# Patient Record
Sex: Female | Born: 1970 | Hispanic: No | Marital: Married | Smoking: Never smoker
Health system: Southern US, Community
[De-identification: ages and names within clinical notes are randomized; demographics above are authoritative.]

## PROBLEM LIST (undated history)

## (undated) DIAGNOSIS — Z8049 Family history of malignant neoplasm of other genital organs: Secondary | ICD-10-CM

## (undated) DIAGNOSIS — C801 Malignant (primary) neoplasm, unspecified: Secondary | ICD-10-CM

## (undated) DIAGNOSIS — M545 Low back pain, unspecified: Secondary | ICD-10-CM

## (undated) DIAGNOSIS — E786 Lipoprotein deficiency: Secondary | ICD-10-CM

## (undated) DIAGNOSIS — R7303 Prediabetes: Secondary | ICD-10-CM

## (undated) DIAGNOSIS — K625 Hemorrhage of anus and rectum: Secondary | ICD-10-CM

## (undated) DIAGNOSIS — R87629 Unspecified abnormal cytological findings in specimens from vagina: Secondary | ICD-10-CM

## (undated) HISTORY — PX: OTHER SURGICAL HISTORY: SHX169

## (undated) HISTORY — DX: Hemorrhage of anus and rectum: K62.5

## (undated) HISTORY — PX: TONSILLECTOMY: SUR1361

## (undated) HISTORY — DX: Family history of malignant neoplasm of other genital organs: Z80.49

## (undated) HISTORY — DX: Low back pain: M54.5

## (undated) HISTORY — DX: Low back pain, unspecified: M54.50

## (undated) HISTORY — PX: HEMORRHOID SURGERY: SHX153

## (undated) HISTORY — DX: Lipoprotein deficiency: E78.6

## (undated) HISTORY — DX: Unspecified abnormal cytological findings in specimens from vagina: R87.629

---

## 2004-04-07 ENCOUNTER — Other Ambulatory Visit: Admission: RE | Admit: 2004-04-07 | Discharge: 2004-04-07 | Payer: Self-pay | Admitting: Obstetrics and Gynecology

## 2005-09-27 ENCOUNTER — Other Ambulatory Visit: Admission: RE | Admit: 2005-09-27 | Discharge: 2005-09-27 | Payer: Self-pay | Admitting: Family Medicine

## 2008-05-08 ENCOUNTER — Other Ambulatory Visit: Admission: RE | Admit: 2008-05-08 | Discharge: 2008-05-08 | Payer: Self-pay | Admitting: Family Medicine

## 2015-05-27 ENCOUNTER — Ambulatory Visit: Payer: Self-pay | Admitting: Dietician

## 2016-11-22 ENCOUNTER — Encounter: Payer: Self-pay | Admitting: Interventional Cardiology

## 2016-12-05 ENCOUNTER — Encounter (INDEPENDENT_AMBULATORY_CARE_PROVIDER_SITE_OTHER): Payer: Self-pay

## 2016-12-05 ENCOUNTER — Encounter: Payer: Self-pay | Admitting: Interventional Cardiology

## 2016-12-05 ENCOUNTER — Ambulatory Visit (INDEPENDENT_AMBULATORY_CARE_PROVIDER_SITE_OTHER): Payer: BLUE CROSS/BLUE SHIELD | Admitting: Interventional Cardiology

## 2016-12-05 VITALS — BP 136/100 | HR 72 | Ht 66.0 in | Wt 161.8 lb

## 2016-12-05 DIAGNOSIS — R079 Chest pain, unspecified: Secondary | ICD-10-CM

## 2016-12-05 DIAGNOSIS — R030 Elevated blood-pressure reading, without diagnosis of hypertension: Secondary | ICD-10-CM | POA: Diagnosis not present

## 2016-12-05 NOTE — Patient Instructions (Signed)
Medication Instructions:  Your physician recommends that you continue on your current medications as directed. Please refer to the Current Medication list given to you today.   Labwork: None ordered  Testing/Procedures: Your physician has requested that you have a stress echocardiogram. For further information please visit [email or web address]. Please follow instruction sheet as given.    Follow-Up: Based on results  Any Other Special Instructions Will Be Listed Below (If Applicable).     If you need a refill on your cardiac medications before your next appointment, please call your pharmacy.

## 2016-12-05 NOTE — Progress Notes (Addendum)
Cardiology Office Note   Date:  12/05/2016   ID:  Krystal Davis, DOB July 31, 1971, MRN [id number]  PCP:  Tawanna Solo, MD  Dr. Kathyrn Lass  No chief complaint on file.  Abdominal pain/ chest pain  Wt Readings from Last 3 Encounters:  12/05/16 161 lb 12.8 oz (73.4 kg)       History of Present Illness: Krystal Davis is a 46 y.o. female  Who was referred by Dr. Kathyrn Lass for chest pain.  She has had chest pressure radiating to the arm intermittently over the last 3 months.  She has had some lightheadedness with walking.  She can have some pain in the head as well.  Rest relieves the pain. She has not fully passed out. It felt like a low blood sugar. She has had some elevated blood sugar readings.  She presents to our clinic for evaluation.      Chest pain can occur at any time- not necessarily with exertion.  Happened in the [place] office.  She does the treadmill 5-7 x/week for 45-60 minutes.  She has some lightheadedness on the treadmill which made her stop.  No chest pressure while on the treadmill.    No prior stress testing.    No family h/o CAD.    She has taken simvastatin for the past 2 weeks after reporting her sx and having a slightly increased blood sugar.     Past Medical History:  Diagnosis Date  . Abnormal Pap smear of vagina   . Low back pain   . Low HDL (under 40)   . Rectal bleeding     Past Surgical History:  Procedure Laterality Date  . CESAREAN SECTION    . HEMORRHOID SURGERY    . TONSILLITIS       Current Outpatient Prescriptions  Medication Sig Dispense Refill  . simvastatin (ZOCOR) 20 MG tablet Take 20 mg by mouth daily.  0   No current facility-administered medications for this visit.     Allergies:   Patient has no known allergies.    Social History:  The patient  reports that she has never smoked. She has never used smokeless tobacco. She reports that she does not drink alcohol or use drugs.   Family History:  The patient's family  history includes CVA in her father; Cancer in her mother; Cancer - Ovarian in her mother; Heart attack in her father; Hypertension in her father.    ROS:  Please see the history of present illness.   Otherwise, review of systems are positive for chest discomfort.   All other systems are reviewed and negative.    PHYSICAL EXAM: VS:  BP (!) 136/100 (BP Location: Right Arm, Patient Position: Sitting, Cuff Size: Normal)   Pulse 72   Ht 5\' 6"  (1.676 m)   Wt 161 lb 12.8 oz (73.4 kg)   LMP 11/23/2016   SpO2 98%   BMI 26.12 kg/m  , BMI Body mass index is 26.12 kg/m. GEN: Well nourished, well developed, in no acute distress  HEENT: normal  Neck: no JVD, carotid bruits, or masses Cardiac: RRR; no murmurs, rubs, or gallops,no edema ; 2+ PT pulses bilaterally Respiratory:  clear to auscultation bilaterally, normal work of breathing GI: soft, nontender, nondistended, + BS MS: no deformity or atrophy  Skin: warm and dry, no rash Neuro:  Strength and sensation are intact Psych: euthymic mood, full affect   EKG:   The ekg ordered today demonstrates NSR, NSST  Recent Labs: No results found for requested labs within last 8760 hours.   Lipid Panel No results found for: CHOL, TRIG, HDL, CHOLHDL, VLDL, LDLCALC, LDLDIRECT   Other studies Reviewed: Additional studies/ records that were reviewed today with results demonstrating: Prior ECG showed NSR with NSST.Marland Kitchen   ASSESSMENT AND PLAN:  1. Chest pain: some typical features but it does not occur with her 60 minute sessions on the treadmill.  It typically occurs with much less exertion.   Plan for stress echo to evaluate for ischemia.  ECG may not be interpretable with plain ETT.   2. Continue simvastatin.  Continue healthy diet to try to maintain low blood sugar.   3. BP slightly increased today.  Continue to follow.  Will see what it does with exercise at the stress test.     Current medicines are reviewed at length with the patient today.   The patient concerns regarding her medicines were addressed.  The following changes have been made:  No change  Labs/ tests ordered today include:  No orders of the defined types were placed in this encounter.   Recommend 150 minutes/week of aerobic exercise Low fat, low carb, high fiber diet recommended  Disposition:   FU in for stress test   Signed, Larae Grooms, MD  12/05/2016 3:55 PM    [facility] [place], [place], [place]  [address] Phone: [phone]; Fax: [phone]

## 2016-12-28 ENCOUNTER — Telehealth (HOSPITAL_COMMUNITY): Payer: Self-pay | Admitting: *Deleted

## 2016-12-28 NOTE — Telephone Encounter (Signed)
Patient given detailed instructions per Stress Test Requisition Sheet for test on 01/01/17 at 7:30.Patient Notified to arrive 30 minutes early, and that it is imperative to arrive on time for appointment to keep from having the test rescheduled.  Patient verbalized understanding. Krystal Davis

## 2017-01-01 ENCOUNTER — Ambulatory Visit (HOSPITAL_COMMUNITY): Payer: BLUE CROSS/BLUE SHIELD | Attending: Cardiology

## 2017-01-01 ENCOUNTER — Ambulatory Visit (HOSPITAL_COMMUNITY): Payer: BLUE CROSS/BLUE SHIELD

## 2017-01-01 DIAGNOSIS — R079 Chest pain, unspecified: Secondary | ICD-10-CM | POA: Diagnosis not present

## 2019-03-06 ENCOUNTER — Other Ambulatory Visit: Payer: Self-pay | Admitting: Family Medicine

## 2019-03-07 ENCOUNTER — Other Ambulatory Visit: Payer: Self-pay | Admitting: Family Medicine

## 2019-03-07 DIAGNOSIS — N6310 Unspecified lump in the right breast, unspecified quadrant: Secondary | ICD-10-CM

## 2019-03-14 ENCOUNTER — Ambulatory Visit
Admission: RE | Admit: 2019-03-14 | Discharge: 2019-03-14 | Disposition: A | Discharge status: Home / Self Care | Payer: BLUE CROSS/BLUE SHIELD | Source: Ambulatory Visit | Attending: Family Medicine | Admitting: Family Medicine

## 2019-03-14 ENCOUNTER — Ambulatory Visit
Admission: RE | Admit: 2019-03-14 | Discharge: 2019-03-14 | Disposition: A | Discharge status: Home / Self Care | Payer: BC Managed Care – PPO | Source: Ambulatory Visit | Attending: Family Medicine | Admitting: Family Medicine

## 2019-03-14 ENCOUNTER — Other Ambulatory Visit: Payer: Self-pay

## 2019-03-14 ENCOUNTER — Other Ambulatory Visit: Payer: Self-pay | Admitting: Family Medicine

## 2019-03-14 DIAGNOSIS — N6310 Unspecified lump in the right breast, unspecified quadrant: Secondary | ICD-10-CM

## 2019-03-17 ENCOUNTER — Ambulatory Visit
Admission: RE | Admit: 2019-03-17 | Discharge: 2019-03-17 | Disposition: A | Discharge status: Home / Self Care | Payer: BC Managed Care – PPO | Source: Ambulatory Visit | Attending: Family Medicine | Admitting: Family Medicine

## 2019-03-17 ENCOUNTER — Other Ambulatory Visit: Payer: Self-pay

## 2019-03-17 DIAGNOSIS — N6310 Unspecified lump in the right breast, unspecified quadrant: Secondary | ICD-10-CM

## 2019-03-19 ENCOUNTER — Telehealth: Payer: Self-pay | Admitting: Hematology and Oncology

## 2019-03-19 NOTE — Telephone Encounter (Signed)
Spoke with patient to confirm morning [facility] appointment for 7/29, packet will be mailed and emailed

## 2019-03-20 ENCOUNTER — Encounter: Payer: Self-pay | Admitting: *Deleted

## 2019-03-25 ENCOUNTER — Other Ambulatory Visit: Payer: Self-pay | Admitting: *Deleted

## 2019-03-25 ENCOUNTER — Other Ambulatory Visit: Payer: Self-pay

## 2019-03-25 ENCOUNTER — Encounter: Payer: Self-pay | Admitting: *Deleted

## 2019-03-25 DIAGNOSIS — C50411 Malignant neoplasm of upper-outer quadrant of right female breast: Secondary | ICD-10-CM | POA: Insufficient documentation

## 2019-03-25 DIAGNOSIS — C50919 Malignant neoplasm of unspecified site of unspecified female breast: Secondary | ICD-10-CM

## 2019-03-25 DIAGNOSIS — Z170 Estrogen receptor positive status [ER+]: Secondary | ICD-10-CM | POA: Insufficient documentation

## 2019-03-25 NOTE — Progress Notes (Addendum)
[place] NOTE  Patient Care Team: Kathyrn Lass, MD as PCP - General (Family Medicine) Mauro Kaufmann, RN as Oncology Nurse Navigator Rockwell Germany, RN as Oncology Nurse Navigator Rolm Bookbinder, MD as Consulting Physician (General Surgery) Nicholas Lose, MD as Consulting Physician (Hematology and Oncology) Eppie Gibson, MD as Attending Physician (Radiation Oncology)  CHIEF COMPLAINTS/PURPOSE OF CONSULTATION:  Newly diagnosed breast cancer  HISTORY OF PRESENTING ILLNESS:  Krystal Davis 48 y.o. female is here because of recent diagnosis of invasive ductal carcinoma of the right breast. The cancer was detected on a diagnostic mammogram on 03/14/19 after the patient palpated a lump in the right breast. It showed a 3.4cm mass in the upper outer quadrant and one right axillary lymph node with cortical thickening measuring 3.39m. Biopsy on 03/17/19 showed grade 1 invasive ductal carcinoma, HER-2 negative (0), ER 100%, PR 60%, Ki67 40% in the right breast and no malignancy in the axilla. She presents to the clinic today for initial evaluation.   I reviewed her records extensively and collaborated the history with the patient.  SUMMARY OF ONCOLOGIC HISTORY: Oncology History  Malignant neoplasm of upper-outer quadrant of right breast in female, estrogen receptor positive ([place]  03/25/2019 Initial Diagnosis   Patient palpated right breast lump. Diagnostic mammogram showed 3.4cm mass in the UOQ and one right axillary lymph node with cortical thickening measuring 3.643m Biopsy showed IDC, grade 1, HER-2 - (0), ER+ 100%, PR+ 60%, Ki67 40% in the right breast and no malignancy in the axilla.     MEDICAL HISTORY:  Past Medical History:  Diagnosis Date  . Abnormal Pap smear of vagina   . Low back pain   . Low HDL (under 40)   . Rectal bleeding     SURGICAL HISTORY: Past Surgical History:  Procedure Laterality Date  . CESAREAN SECTION    . HEMORRHOID SURGERY     . TONSILLECTOMY    . TONSILLITIS      SOCIAL HISTORY: Social History   Socioeconomic History  . Marital status: Married    Spouse name: Not on file  . Number of children: 1  . Years of education: Not on file  . Highest education level: Not on file  Occupational History  . Occupation: ITMaterials engineerSocial Needs  . Financial resource strain: Not on file  . Food insecurity    Worry: Not on file    Inability: Not on file  . Transportation needs    Medical: Not on file    Non-medical: Not on file  Tobacco Use  . Smoking status: Never Smoker  . Smokeless tobacco: Never Used  Substance and Sexual Activity  . Alcohol use: No  . Drug use: No  . Sexual activity: Not on file  Lifestyle  . Physical activity    Days per week: Not on file    Minutes per session: Not on file  . Stress: Not on file  Relationships  . Social coHerbalistn phone: Not on file    Gets together: Not on file    Attends religious service: Not on file    Active member of club or organization: Not on file    Attends meetings of clubs or organizations: Not on file    Relationship status: Not on file  . Intimate partner violence    Fear of current or ex partner: Not on file    Emotionally abused: Not on file    Physically abused:  Not on file    Forced sexual activity: Not on file  Other Topics Concern  . Not on file  Social History Narrative  . Not on file    FAMILY HISTORY: Family History  Problem Relation Age of Onset  . Cancer Mother        Uterine and Endometrial  . Cancer - Ovarian Mother   . Hypertension Father   . CVA Father   . Heart attack Father     ALLERGIES:  has No Known Allergies.  MEDICATIONS:  Current Outpatient Medications  Medication Sig Dispense Refill  . Biotin 10000 MCG TABS Take 10,000 mcg by mouth daily.    . fenoprofen (NALFON) 600 MG TABS tablet Take 600 mg by mouth daily.    . metFORMIN (GLUCOPHAGE) 500 MG tablet Take 500 mg by mouth daily with  breakfast.    . Multiple Vitamins-Minerals (MULTIVITAMIN WITH MINERALS) tablet Take 1 tablet by mouth daily.    . Omega-3 Fatty Acids (FISH OIL) 1200 MG CAPS Take 1,200 mg by mouth daily.    . Polysaccharide Iron Complex (IRON UP PO) Take 65 mg by mouth daily.    . simvastatin (ZOCOR) 20 MG tablet Take 20 mg by mouth daily.  0  . vitamin B-12 (CYANOCOBALAMIN) 500 MCG tablet Take 500 mcg by mouth daily.    . vitamin E 400 UNIT capsule Take 400 Units by mouth daily.    . tamoxifen (NOLVADEX) 20 MG tablet Take 1 tablet (20 mg total) by mouth daily. 30 tablet 0   No current facility-administered medications for this visit.     REVIEW OF SYSTEMS:   Constitutional: Denies fevers, chills or abnormal night sweats Eyes: Denies blurriness of vision, double vision or watery eyes Ears, nose, mouth, throat, and face: Denies mucositis or sore throat Respiratory: Denies cough, dyspnea or wheezes Cardiovascular: Denies palpitation, chest discomfort or lower extremity swelling Gastrointestinal:  Denies nausea, heartburn or change in bowel habits Skin: Denies abnormal skin rashes Lymphatics: Denies new lymphadenopathy or easy bruising Neurological:Denies numbness, tingling or new weaknesses Behavioral/Psych: Mood is stable, no new changes  Breast: Palpable right breast lump All other systems were reviewed with the patient and are negative.  PHYSICAL EXAMINATION: ECOG PERFORMANCE STATUS: 1 - Symptomatic but completely ambulatory  Vitals:   03/26/19 0911  BP: 130/81  Pulse: 78  Resp: 18  Temp: 98.2 F (36.8 C)  SpO2: 100%   Filed Weights   03/26/19 0911  Weight: 170 lb 3.2 oz (77.2 kg)    GENERAL:alert, no distress and comfortable SKIN: skin color, texture, turgor are normal, no rashes or significant lesions EYES: normal, conjunctiva are pink and non-injected, sclera clear OROPHARYNX:no exudate, no erythema and lips, buccal mucosa, and tongue normal  NECK: supple, thyroid normal size,  non-tender, without nodularity LYMPH:  no palpable lymphadenopathy in the cervical, axillary or inguinal LUNGS: clear to auscultation and percussion with normal breathing effort HEART: regular rate & rhythm and no murmurs and no lower extremity edema ABDOMEN:abdomen soft, non-tender and normal bowel sounds Musculoskeletal:no cyanosis of digits and no clubbing  PSYCH: alert & oriented x 3 with fluent speech NEURO: no focal motor/sensory deficits   LABORATORY DATA:  I have reviewed the data as listed Lab Results  Component Value Date   WBC 7.2 03/26/2019   HGB 12.7 03/26/2019   HCT 38.6 03/26/2019   MCV 88.7 03/26/2019   PLT 305 03/26/2019   Lab Results  Component Value Date   NA 139 03/26/2019  K 4.2 03/26/2019   CL 104 03/26/2019   CO2 26 03/26/2019    RADIOGRAPHIC STUDIES: I have personally reviewed the radiological reports and agreed with the findings in the report.  ASSESSMENT AND PLAN:  Malignant neoplasm of upper-outer quadrant of right breast in female, estrogen receptor positive ([place]) 03/25/2019:Patient palpated right breast lump. Diagnostic mammogram showed 3.4cm mass in the UOQ and one right axillary lymph node with cortical thickening measuring 3.68m. 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 T2 N0 stage Ib clinical stage  Pathology and radiology counseling:Discussed with the patient, the details of pathology including the type of breast cancer,the clinical staging, the significance of ER, PR and HER-2/neu receptors and the implications for treatment. After reviewing the pathology in detail, we proceeded to discuss the different treatment options between surgery, radiation, chemotherapy, antiestrogen therapies.  Recommendations: 1. Breast conserving surgery versus mastectomy based on MRI and targeted node dissection followed by 2. Oncotype DX testing to determine if chemotherapy would be  of any benefit or MammaPrint if the lymph node is positive. 3. Adjuvant radiation therapy followed by 4. Adjuvant antiestrogen therapy  Breast MRI Genetics consultation  Oncotype counseling: I discussed Oncotype DX test. I explained to the patient that this is a 21 gene panel to evaluate patient tumors DNA to calculate recurrence score. This would help determine whether patient has high risk or intermediate risk or low risk breast cancer. She understands that if her tumor was found to be high risk, she would benefit from systemic chemotherapy. If low risk, no need of chemotherapy. If she was found to be intermediate risk, we would need to evaluate the score as well as other risk factors and determine if an abbreviated chemotherapy may be of benefit.  Return to clinic after surgery to discuss final pathology report and then determine if Oncotype DX/MammaPrint testing will need to be sent.     All questions were answered. The patient knows to call the clinic with any problems, questions or concerns.   VRulon Eisenmenger MD 03/26/2019    I, Molly Dorshimer, am acting as scribe for VNicholas Lose MD.  I have reviewed the above documentation for accuracy and completeness, and I agree with the above.  Addendum: Because it could be few weeks before her surgery, I started her on tamoxifen 20 mg daily.  I went over the risks and benefits of tamoxifen.  She certainly is concerned about the risk of uterine changes family history of uterine cancer in her mother.  When we are ready to put her on long-term antiestrogen therapy we will have to make a decision regarding ovarian suppression with anastrozole therapy versus continuing on tamoxifen.

## 2019-03-26 ENCOUNTER — Encounter: Payer: Self-pay | Admitting: Physical Therapy

## 2019-03-26 ENCOUNTER — Encounter: Payer: Self-pay | Admitting: Hematology and Oncology

## 2019-03-26 ENCOUNTER — Other Ambulatory Visit: Payer: Self-pay | Admitting: *Deleted

## 2019-03-26 ENCOUNTER — Other Ambulatory Visit: Payer: Self-pay

## 2019-03-26 ENCOUNTER — Encounter: Payer: Self-pay | Admitting: Radiation Oncology

## 2019-03-26 ENCOUNTER — Inpatient Hospital Stay: Payer: BC Managed Care – PPO | Attending: Hematology and Oncology | Admitting: Hematology and Oncology

## 2019-03-26 ENCOUNTER — Encounter: Payer: Self-pay | Admitting: Licensed Clinical Social Worker

## 2019-03-26 ENCOUNTER — Ambulatory Visit: Payer: BC Managed Care – PPO | Attending: General Surgery | Admitting: Physical Therapy

## 2019-03-26 ENCOUNTER — Inpatient Hospital Stay: Payer: BC Managed Care – PPO

## 2019-03-26 ENCOUNTER — Ambulatory Visit (HOSPITAL_BASED_OUTPATIENT_CLINIC_OR_DEPARTMENT_OTHER): Payer: BC Managed Care – PPO | Admitting: Licensed Clinical Social Worker

## 2019-03-26 ENCOUNTER — Ambulatory Visit
Admission: RE | Admit: 2019-03-26 | Discharge: 2019-03-26 | Disposition: A | Discharge status: Home / Self Care | Payer: BC Managed Care – PPO | Source: Ambulatory Visit | Attending: Radiation Oncology | Admitting: Radiation Oncology

## 2019-03-26 DIAGNOSIS — C50411 Malignant neoplasm of upper-outer quadrant of right female breast: Secondary | ICD-10-CM

## 2019-03-26 DIAGNOSIS — Z7984 Long term (current) use of oral hypoglycemic drugs: Secondary | ICD-10-CM

## 2019-03-26 DIAGNOSIS — Z170 Estrogen receptor positive status [ER+]: Secondary | ICD-10-CM | POA: Insufficient documentation

## 2019-03-26 DIAGNOSIS — Z8049 Family history of malignant neoplasm of other genital organs: Secondary | ICD-10-CM | POA: Diagnosis not present

## 2019-03-26 DIAGNOSIS — R293 Abnormal posture: Secondary | ICD-10-CM | POA: Insufficient documentation

## 2019-03-26 DIAGNOSIS — Z79899 Other long term (current) drug therapy: Secondary | ICD-10-CM | POA: Diagnosis not present

## 2019-03-26 DIAGNOSIS — C50919 Malignant neoplasm of unspecified site of unspecified female breast: Secondary | ICD-10-CM

## 2019-03-26 LAB — CBC WITH DIFFERENTIAL (CANCER CENTER ONLY)
Abs Immature Granulocytes: 0.02 10*3/uL (ref 0.00–0.07)
Basophils Absolute: 0.1 10*3/uL (ref 0.0–0.1)
Basophils Relative: 1 %
Eosinophils Absolute: 0.2 10*3/uL (ref 0.0–0.5)
Eosinophils Relative: 2 %
HCT: 38.6 % (ref 36.0–46.0)
Hemoglobin: 12.7 g/dL (ref 12.0–15.0)
Immature Granulocytes: 0 %
Lymphocytes Relative: 30 %
Lymphs Abs: 2.2 10*3/uL (ref 0.7–4.0)
MCH: 29.2 pg (ref 26.0–34.0)
MCHC: 32.9 g/dL (ref 30.0–36.0)
MCV: 88.7 fL (ref 80.0–100.0)
Monocytes Absolute: 0.5 10*3/uL (ref 0.1–1.0)
Monocytes Relative: 7 %
Neutro Abs: 4.3 10*3/uL (ref 1.7–7.7)
Neutrophils Relative %: 60 %
Platelet Count: 305 10*3/uL (ref 150–400)
RBC: 4.35 MIL/uL (ref 3.87–5.11)
RDW: 12.3 % (ref 11.5–15.5)
WBC Count: 7.2 10*3/uL (ref 4.0–10.5)
nRBC: 0 % (ref 0.0–0.2)

## 2019-03-26 LAB — CMP (CANCER CENTER ONLY)
ALT: 15 U/L (ref 0–44)
AST: 13 U/L — ABNORMAL LOW (ref 15–41)
Albumin: 3.6 g/dL (ref 3.5–5.0)
Alkaline Phosphatase: 60 U/L (ref 38–126)
Anion gap: 9 (ref 5–15)
BUN: 12 mg/dL (ref 6–20)
CO2: 26 mmol/L (ref 22–32)
Calcium: 9.4 mg/dL (ref 8.9–10.3)
Chloride: 104 mmol/L (ref 98–111)
Creatinine: 0.84 mg/dL (ref 0.44–1.00)
GFR, Est AFR Am: >60 mL/min (ref >60)
GFR, Estimated: >60 mL/min (ref >60)
Glucose, Bld: 176 mg/dL — ABNORMAL HIGH (ref 70–99)
Potassium: 4.2 mmol/L (ref 3.5–5.1)
Sodium: 139 mmol/L (ref 135–145)
Total Bilirubin: 0.3 mg/dL (ref 0.3–1.2)
Total Protein: 6.7 g/dL (ref 6.5–8.1)

## 2019-03-26 MED ORDER — TAMOXIFEN CITRATE 20 MG PO TABS: 20.0000 mg | ORAL_TABLET | Freq: Every day | ORAL | 0 refills | Status: DC

## 2019-03-26 NOTE — Assessment & Plan Note (Signed)
03/25/2019:Patient palpated right breast lump. Diagnostic mammogram showed 3.4cm mass in the UOQ and one right axillary lymph node with cortical thickening measuring 3.56m. 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 T2 N0 stage Ib clinical stage  Pathology and radiology counseling:Discussed with the patient, the details of pathology including the type of breast cancer,the clinical staging, the significance of ER, PR and HER-2/neu receptors and the implications for treatment. After reviewing the pathology in detail, we proceeded to discuss the different treatment options between surgery, radiation, chemotherapy, antiestrogen therapies.  Recommendations: 1. Breast conserving surgery versus mastectomy based on MRI and targeted node dissection followed by 2. Oncotype DX testing to determine if chemotherapy would be of any benefit or MammaPrint if the lymph node is positive. 3. Adjuvant radiation therapy followed by 4. Adjuvant antiestrogen therapy  Breast MRI Genetics consultation  Oncotype counseling: I discussed Oncotype DX test. I explained to the patient that this is a 21 gene panel to evaluate patient tumors DNA to calculate recurrence score. This would help determine whether patient has high risk or intermediate risk or low risk breast cancer. She understands that if her tumor was found to be high risk, she would benefit from systemic chemotherapy. If low risk, no need of chemotherapy. If she was found to be intermediate risk, we would need to evaluate the score as well as other risk factors and determine if an abbreviated chemotherapy may be of benefit.  Return to clinic after surgery to discuss final pathology report and then determine if Oncotype DX/MammaPrint testing will need to be sent.

## 2019-03-26 NOTE — Therapy (Signed)
[place], [place], [address] Phone: [phone]   Fax:  [phone]  Physical Therapy Evaluation  Patient Details  Name: Krystal Davis MRN: [id number] Date of Birth: 1971-04-18 Referring Provider (PT): Dr. Rolm Bookbinder   Encounter Date: 03/26/2019  PT End of Session - 03/26/19 1115    Visit Number  1    Number of Visits  2    Date for PT Re-Evaluation  05/21/19    PT Start Time  1125    PT Stop Time  1153    PT Time Calculation (min)  28 min    Activity Tolerance  Patient tolerated treatment well    Behavior During Therapy  [facility] for tasks assessed/performed       Past Medical History:  Diagnosis Date  . Abnormal Pap smear of vagina   . Low back pain   . Low HDL (under 40)   . Rectal bleeding     Past Surgical History:  Procedure Laterality Date  . CESAREAN SECTION    . HEMORRHOID SURGERY    . TONSILLECTOMY    . TONSILLITIS      There were no vitals filed for this visit.   Subjective Assessment - 03/26/19 1107    Subjective  Patient reports she is here today to be seen by her medical team for her newly diagnosed right breast cancer.    Pertinent History  Patient was diagnosed on 03/14/2019 with right grade I invasive ductal carcinoma breast cancer. It measures 3.4 cm and is located in the upper outer quadrant. It is ER/PR positive and HER2 negative with a Ki67 of 40%.    Patient Stated Goals  Reduce lymphedema risk and learn post op shoulder ROM HEP    Currently in Pain?  No/denies         [facility] PT Assessment - 03/26/19 0001      Assessment   Medical Diagnosis  Right breast cancer    Referring Provider (PT)  Dr. Rolm Bookbinder    Onset Date/Surgical Date  03/14/19    Hand Dominance  Right    Prior Therapy  none      Precautions   Precautions  Other (comment)    Precaution Comments  active cancer      Restrictions   Weight Bearing Restrictions  No      Balance  Screen   Has the patient fallen in the past 6 months  No    Has the patient had a decrease in activity level because of a fear of falling?   No    Is the patient reluctant to leave their home because of a fear of falling?   No      Home Environment   Living Environment  Private residence    Living Arrangements  Spouse/significant other    Available Help at Discharge  Family      Prior Function   Level of Independence  Independent    Vocation  Full time employment    Engineer, materials    Leisure  She walks 6-7 days per week for 1 hour      Cognition   Overall Cognitive Status  Within Functional Limits for tasks assessed      Posture/Postural Control   Posture/Postural Control  Postural limitations    Postural Limitations  Rounded Shoulders;Forward head      ROM / Strength   AROM / PROM / Strength  AROM;Strength  AROM   AROM Assessment Site  Shoulder;Cervical    Right/Left Shoulder  Right;Left    Right Shoulder Extension  52 Degrees    Right Shoulder Flexion  149 Degrees    Right Shoulder ABduction  156 Degrees    Right Shoulder Internal Rotation  73 Degrees    Right Shoulder External Rotation  81 Degrees    Left Shoulder Extension  56 Degrees    Left Shoulder Flexion  144 Degrees    Left Shoulder ABduction  158 Degrees    Left Shoulder Internal Rotation  69 Degrees    Left Shoulder External Rotation  89 Degrees      Strength   Overall Strength  Within functional limits for tasks performed        LYMPHEDEMA/ONCOLOGY QUESTIONNAIRE - 03/26/19 1112      Type   Cancer Type  Right breast cancer      Lymphedema Assessments   Lymphedema Assessments  Upper extremities      Right Upper Extremity Lymphedema   10 cm Proximal to Olecranon Process  31.4 cm    Olecranon Process  28 cm    10 cm Proximal to Ulnar Styloid Process  23.8 cm    Just Proximal to Ulnar Styloid Process  15.5 cm    Across Hand at [employer]  19.6 cm    At [place] of 2nd  Digit  6 cm      Left Upper Extremity Lymphedema   10 cm Proximal to Olecranon Process  32.1 cm    Olecranon Process  28.5 cm    10 cm Proximal to Ulnar Styloid Process  23.5 cm    Just Proximal to Ulnar Styloid Process  15.6 cm    Across Hand at [employer]  19.1 cm    At [place] of 2nd Digit  6 cm          Quick Dash - 03/26/19 0001    Open a tight or new jar  No difficulty    Do heavy household chores (wash walls, wash floors)  No difficulty    Carry a shopping bag or briefcase  No difficulty    Wash your back  No difficulty    Use a knife to cut food  No difficulty    Recreational activities in which you take some force or impact through your arm, shoulder, or hand (golf, hammering, tennis)  No difficulty    During the past week, to what extent has your arm, shoulder or hand problem interfered with your normal social activities with family, friends, neighbors, or groups?  Not at all    During the past week, to what extent has your arm, shoulder or hand problem limited your work or other regular daily activities  Not at all    Arm, shoulder, or hand pain.  None    Tingling (pins and needles) in your arm, shoulder, or hand  None    Difficulty Sleeping  No difficulty    DASH Score  0 %        Objective measurements completed on examination: See above findings.        Patient was instructed today in a home exercise program today for post op shoulder range of motion. These included active assist shoulder flexion in sitting, scapular retraction, wall walking with shoulder abduction, and hands behind head external rotation.  She was encouraged to do these twice a day, holding 3 seconds and repeating 5 times when permitted by  her physician.          PT Education - 03/26/19 1112    Education Details  Lymphedema risk reduction and post op shoulder ROM HEP    Person(s) Educated  Patient    Methods  Explanation;Demonstration;Handout    Comprehension  Returned  demonstration;Verbalized understanding          PT Long Term Goals - 03/26/19 1118      PT LONG TERM GOAL #1   Title  Patient will demonstrate she has regained full shoulder ROM and function post operatively compared to baselines.    Time  8    Period  Weeks    Status  New      Breast Clinic Goals - 03/26/19 1118      Patient will be able to verbalize understanding of pertinent lymphedema risk reduction practices relevant to her diagnosis specifically related to skin care.   Time  1    Status  Achieved      Patient will be able to return demonstrate and/or verbalize understanding of the post-op home exercise program related to regaining shoulder range of motion.   Time  1    Period  Days    Status  Achieved      Patient will be able to verbalize understanding of the importance of attending the postoperative After Breast Cancer Class for further lymphedema risk reduction education and therapeutic exercise.   Time  1    Period  Days    Status  Achieved            Plan - 03/26/19 1116    Clinical Impression Statement  Patient was diagnosed on 03/14/2019 with right grade I invasive ductal carcinoma breast cancer. It measures 3.4 cm and is located in the upper outer quadrant. It is ER/PR positive and HER2 negative with a Ki67 of 40%. Her multidisciplinary medical team met prior to her assessments to determine a recommended treatment plan. She is planning to have a right lumpectomy or mastectomy with a sentinel node biopsy and targeted node dissection for 1 enlarged lymph node followed by Oncotype testing, radiation, and anti-estrogen therapy. She will benefit from a post op PT visit to determine needs.    Stability/Clinical Decision Making  Stable/Uncomplicated    Clinical Decision Making  Low    Rehab Potential  Excellent    PT Frequency  --   Eval and 1 f/u visit   PT Treatment/Interventions  ADLs/Self Care Home Management;Therapeutic exercise;Patient/family education    PT  Next Visit Plan  Will reassess 3-4 weeks post op to determine needs    PT Home Exercise Plan  Post op shoulder ROM HEP    Consulted and Agree with Plan of Care  Patient       Patient will benefit from skilled therapeutic intervention in order to improve the following deficits and impairments:     Visit Diagnosis: 1. Malignant neoplasm of upper-outer quadrant of right breast in female, estrogen receptor positive ([place])   2. Abnormal posture      Patient will follow up at outpatient cancer rehab 3-4 weeks following surgery.  If the patient requires physical therapy at that time, a specific plan will be dictated and sent to the referring physician for approval. The patient was educated today on appropriate basic range of motion exercises to begin post operatively and the importance of attending the After Breast Cancer class following surgery.  Patient was educated today on lymphedema risk reduction practices as it pertains  to recommendations that will benefit the patient immediately following surgery.  She verbalized good understanding.     Problem List Patient Active Problem List   Diagnosis Date Noted  . Malignant neoplasm of upper-outer quadrant of right breast in female, estrogen receptor positive ([place]) 03/25/2019   Annia Friendly, PT 03/26/19 12:09 PM  [place], [place], [address] Phone: [phone]   Fax:  [phone]  Name: Krystal Davis MRN: [id number] Date of Birth: [birth year]

## 2019-03-26 NOTE — Progress Notes (Addendum)
Radiation Oncology         [phone] ________________________________  Initial outpatient Consultation  Name: Krystal Davis MRN: [id number]  Date: 03/26/2019  DOB: 12/28/1970  HF:[id number], Lattie Haw, MD  Kathyrn Lass, MD   REFERRING PHYSICIAN: Kathyrn Lass, MD  DIAGNOSIS:    ICD-10-CM   1. Malignant neoplasm of upper-outer quadrant of right breast in female, estrogen receptor positive ([place])  C50.411    Z17.0    Cancer Staging Malignant neoplasm of upper-outer quadrant of right breast in female, estrogen receptor positive ([place]) Staging form: Breast, AJCC 8th Edition - Clinical stage from 03/26/2019: Stage IB (cT2, cN0, cM0, G1, ER+, PR+, HER2-) - Signed by Nicholas Lose, MD on 03/26/2019   CHIEF COMPLAINT: Here to discuss management of right breast cancer  HISTORY OF PRESENT ILLNESS::Krystal Davis is a 48 y.o. female who presented with a palpable right breast mass.  Diagnostic mammogram revealed dense tissue and an ultrasound localized of 3.4 cm mass at the 9:30 position of the right breast.  There is a single axillary node on ultrasound with a focally thickened cortex.  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  Biopsy of the lymph node is benign but this is felt to be discordant.  Patient was seen in person today and her husband spoke with me by speaker phone.  On review of systems she reports glasses, borderline diabetes, and a lump in her breast  PREVIOUS RADIATION THERAPY: No  PAST MEDICAL HISTORY:  has a past medical history of Abnormal Pap smear of vagina, Family history of uterine cancer, Low back pain, Low HDL (under 40), and Rectal bleeding.    PAST SURGICAL HISTORY: Past Surgical History:  Procedure Laterality Date   CESAREAN SECTION     HEMORRHOID SURGERY     TONSILLECTOMY     TONSILLITIS      FAMILY HISTORY: family history includes CVA in her father; Cancer (age of onset: 42)  in her mother; Heart attack in her father; Hypertension in her father.  SOCIAL HISTORY:  reports that she has never smoked. She has never used smokeless tobacco. She reports that she does not drink alcohol or use drugs.  ALLERGIES: Patient has no known allergies.  MEDICATIONS:  Current Outpatient Medications  Medication Sig Dispense Refill   Biotin 10000 MCG TABS Take 10,000 mcg by mouth daily.     fenoprofen (NALFON) 600 MG TABS tablet Take 600 mg by mouth daily.     metFORMIN (GLUCOPHAGE) 500 MG tablet Take 500 mg by mouth daily with breakfast.     Multiple Vitamins-Minerals (MULTIVITAMIN WITH MINERALS) tablet Take 1 tablet by mouth daily.     Omega-3 Fatty Acids (FISH OIL) 1200 MG CAPS Take 1,200 mg by mouth daily.     Polysaccharide Iron Complex (IRON UP PO) Take 65 mg by mouth daily.     simvastatin (ZOCOR) 20 MG tablet Take 20 mg by mouth daily.  0   tamoxifen (NOLVADEX) 20 MG tablet Take 1 tablet (20 mg total) by mouth daily. 30 tablet 0   vitamin B-12 (CYANOCOBALAMIN) 500 MCG tablet Take 500 mcg by mouth daily.     vitamin E 400 UNIT capsule Take 400 Units by mouth daily.     No current facility-administered medications for this encounter.     REVIEW OF SYSTEMS: A 10+ POINT REVIEW OF SYSTEMS WAS OBTAINED including neurology, dermatology, psychiatry, cardiac, respiratory, lymph, extremities, GI, GU,  Musculoskeletal, constitutional, breasts, reproductive, HEENT.  All pertinent positives are noted in the HPI.  All others are negative.   PHYSICAL EXAM:  Vitals with BMI 03/26/2019  Height _0   Weight 170 lbs 3 oz  BMI 35.46  Systolic 568  Diastolic 81  Pulse 78  Respirations 18   General: Alert and oriented, in no acute distress Skin- no concerning lesions  Psychiatric: Judgment and insight are intact. Affect is appropriate. Breasts: In the right breast at the 9 o'clock position there is a mass that measures about 5 cm but some of this could be biopsy artifact. No  other palpable masses appreciated in the breasts or axillae bilaterally.    ECOG = 0  0 - Asymptomatic (Fully active, able to carry on all predisease activities without restriction)  1 - Symptomatic but completely ambulatory (Restricted in physically strenuous activity but ambulatory and able to carry out work of a light or sedentary nature. For example, light housework, office work)  2 - Symptomatic, <50% in bed during the day (Ambulatory and capable of all self care but unable to carry out any work activities. Up and about more than 50% of waking hours)  3 - Symptomatic, >50% in bed, but not bedbound (Capable of only limited self-care, confined to bed or chair 50% or more of waking hours)  4 - Bedbound (Completely disabled. Cannot carry on any self-care. Totally confined to bed or chair)  5 - Death   Eustace Pen MM, Creech RH, Tormey DC, et al. [phone]). "Toxicity and response criteria of the [facility]". [place] Oncol. 5 (6): 649-55   LABORATORY DATA:  Lab Results  Component Value Date   WBC 7.2 03/26/2019   HGB 12.7 03/26/2019   HCT 38.6 03/26/2019   MCV 88.7 03/26/2019   PLT 305 03/26/2019   CMP     Component Value Date/Time   NA 139 03/26/2019 0823   K 4.2 03/26/2019 0823   CL 104 03/26/2019 0823   CO2 26 03/26/2019 0823   GLUCOSE 176 (H) 03/26/2019 0823   BUN 12 03/26/2019 0823   CREATININE 0.84 03/26/2019 0823   CALCIUM 9.4 03/26/2019 0823   PROT 6.7 03/26/2019 0823   ALBUMIN 3.6 03/26/2019 0823   AST 13 (L) 03/26/2019 0823   ALT 15 03/26/2019 0823   ALKPHOS 60 03/26/2019 0823   BILITOT 0.3 03/26/2019 0823   GFRNONAA >60 03/26/2019 0823   GFRAA >60 03/26/2019 0823         RADIOGRAPHY: US Breast Ltd Uni Right Inc Axilla  Result Date: 03/14/2019 CLINICAL DATA:  Palpable abnormality in the RIGHT breast 10 o'clock location. EXAM: DIGITAL DIAGNOSTIC BILATERAL MAMMOGRAM WITH CAD AND TOMO ULTRASOUND RIGHT BREAST COMPARISON:  05/05/2016 and  earlier ACR Breast Density Category c: The breast tissue is heterogeneously dense, which may obscure small masses. FINDINGS: Spot tangential view in the UPPER-OUTER QUADRANT of the RIGHT breast reveals extremely dense tissue without discrete mass or distortion. The skin of the anterior breast is slightly thickened. LEFT breast is negative. Mammographic images were processed with CAD. On physical exam, I palpate a firm mass in the UPPER-OUTER QUADRANT of the RIGHT breast, 7-8 centimeters by my exam. Targeted ultrasound is performed, showing irregular hypoechoic mass with irregular margins in the 9:30 o'clock location of the RIGHT breast 5 centimeters from the nipple. The hypoechoic portion is 2.1 x 2.4 centimeters. On elastography, the lesion is firm and measures larger, 3.4 x 2.0 x 3.4 centimeters. Evaluation of the  RIGHT axilla shows a single lymph node with focally thickened cortex, measuring up to 3.6 millimeters. Other RIGHT axillary lymph nodes have normal morphology. IMPRESSION: 1. Suspicious mass in the UPPER-OUTER QUADRANT of the RIGHT breast estimated to measure 3.4 x 2.0 x 3.4 centimeters. 2. Single RIGHT axillary lymph node with focally thickened cortex. RECOMMENDATION: Ultrasound-guided core biopsy of mass in the 9:30 o'clock location of the RIGHT breast. Ultrasound-guided core biopsy of lymph node with focally thickened cortex in the RIGHT axilla. If biopsies are positive, I would recommend further evaluation with MRI, given the slightly thickened skin of the RIGHT breast. I have discussed the findings and recommendations with the patient. Results were also provided in writing at the conclusion of the visit. If applicable, a reminder letter will be sent to the patient regarding the next appointment. BI-RADS CATEGORY  4: Suspicious. Electronically Signed   By: Nolon Nations M.D.   On: 03/14/2019 11:30   Mm Diag Breast Tomo Bilateral  Result Date: 03/14/2019 CLINICAL DATA:  Palpable abnormality in  the RIGHT breast 10 o'clock location. EXAM: DIGITAL DIAGNOSTIC BILATERAL MAMMOGRAM WITH CAD AND TOMO ULTRASOUND RIGHT BREAST COMPARISON:  05/05/2016 and earlier ACR Breast Density Category c: The breast tissue is heterogeneously dense, which may obscure small masses. FINDINGS: Spot tangential view in the UPPER-OUTER QUADRANT of the RIGHT breast reveals extremely dense tissue without discrete mass or distortion. The skin of the anterior breast is slightly thickened. LEFT breast is negative. Mammographic images were processed with CAD. On physical exam, I palpate a firm mass in the UPPER-OUTER QUADRANT of the RIGHT breast, 7-8 centimeters by my exam. Targeted ultrasound is performed, showing irregular hypoechoic mass with irregular margins in the 9:30 o'clock location of the RIGHT breast 5 centimeters from the nipple. The hypoechoic portion is 2.1 x 2.4 centimeters. On elastography, the lesion is firm and measures larger, 3.4 x 2.0 x 3.4 centimeters. Evaluation of the RIGHT axilla shows a single lymph node with focally thickened cortex, measuring up to 3.6 millimeters. Other RIGHT axillary lymph nodes have normal morphology. IMPRESSION: 1. Suspicious mass in the UPPER-OUTER QUADRANT of the RIGHT breast estimated to measure 3.4 x 2.0 x 3.4 centimeters. 2. Single RIGHT axillary lymph node with focally thickened cortex. RECOMMENDATION: Ultrasound-guided core biopsy of mass in the 9:30 o'clock location of the RIGHT breast. Ultrasound-guided core biopsy of lymph node with focally thickened cortex in the RIGHT axilla. If biopsies are positive, I would recommend further evaluation with MRI, given the slightly thickened skin of the RIGHT breast. I have discussed the findings and recommendations with the patient. Results were also provided in writing at the conclusion of the visit. If applicable, a reminder letter will be sent to the patient regarding the next appointment. BI-RADS CATEGORY  4: Suspicious. Electronically Signed    By: Nolon Nations M.D.   On: 03/14/2019 11:30   [place] Axillary Node Core Biopsy Right  Addendum Date: 03/21/2019   ADDENDUM REPORT: 03/19/2019 13:22 ADDENDUM: Pathology revealed GRADE I INVASIVE DUCTAL CARCINOMA of the Right breast, 9:30 o'clock. This was found to be concordant by Dr. Curlene Dolphin. Pathology revealed BENIGN SOFT TISSUE, NO LYMPHOID TISSUE of the Right axilla. This was found to be discordant by Dr. Curlene Dolphin. Pathology results were discussed with the patient by telephone. The patient reported doing well after the biopsies with tenderness at the sites, and stiffness of her Right arm. Post biopsy instructions and care were reviewed and questions were answered. The patient was encouraged to call [facility] for any additional concerns. The patient was referred to [facility] on March 26, 2019. Recommendation for a bilateral breast MRI, given the slightly thickened skin of the Right breast, and heterogeneously dense breasts. Pathology results reported by Terie Purser, RN on 03/19/2019. Electronically Signed   By: Curlene Dolphin M.D.   On: 03/19/2019 13:22   Result Date: 03/21/2019 CLINICAL DATA:  Ultrasound-guided core needle biopsy was recommended of a right axillary lymph node. EXAM: [place] AXILLARY NODE CORE BIOPSY RIGHT COMPARISON:  Previous exam(s). FINDINGS: I met with the patient and we discussed the procedure of ultrasound-guided biopsy, including benefits and alternatives. We discussed the high likelihood of a successful procedure. We discussed the risks of the procedure, including infection, bleeding, tissue injury, clip migration, and inadequate sampling. Informed written consent was given. The usual time-out protocol was performed immediately prior to the procedure. Using sterile technique and 1% Lidocaine as local anesthetic, under direct ultrasound visualization, a 14 gauge  spring-loaded device was used to perform biopsy of a right axillary lymph node using a lateral approach. At the conclusion of the procedure a HydroMARK tissue marker clip was deployed into the biopsy cavity. Follow up 2 view mammogram was performed and dictated separately. IMPRESSION: Ultrasound guided biopsy of a right axillary lymph node. No apparent complications. Electronically Signed: By: Curlene Dolphin M.D. On: 03/17/2019 17:01   Mm Clip Placement Right  Result Date: 03/17/2019 CLINICAL DATA:  Ultrasound-guided biopsies of a palpable right breast mass and a right axillary lymph node were performed. EXAM: DIAGNOSTIC RIGHT MAMMOGRAM POST ULTRASOUND BIOPSIES COMPARISON:  Previous exam(s). FINDINGS: Mammographic images were obtained following ultrasound guided biopsy of a palpable right breast mass 9:30 position and a right axillary lymph node with borderline cortical thickening. A heart shaped biopsy clip is satisfactorily positioned within the biopsied mass. A HydroMARK biopsy clip is satisfactorily positioned in the right axilla. IMPRESSION: Satisfactory position of biopsy clips as described above. Final Assessment: Post Procedure Mammograms for Marker Placement Electronically Signed   By: Curlene Dolphin M.D.   On: 03/17/2019 17:07   [place] Rt Breast Bx W Loc Dev 1st Lesion Img Bx Spec US Guide  Addendum Date: 03/21/2019   ADDENDUM REPORT: 03/19/2019 13:23 ADDENDUM: Pathology revealed GRADE I INVASIVE DUCTAL CARCINOMA of the Right breast, 9:30 o'clock. This was found to be concordant by Dr. Curlene Dolphin. Pathology revealed BENIGN SOFT TISSUE, NO LYMPHOID TISSUE of the Right axilla. This was found to be discordant by Dr. Curlene Dolphin. Pathology results were discussed with the patient by telephone. The patient reported doing well after the biopsies with tenderness at the sites, and stiffness of her Right arm. Post biopsy instructions and care were reviewed and questions were answered. The patient was encouraged  to call [facility] for any additional concerns. The patient was referred to [facility] at [facility] on March 26, 2019. Recommendation for a bilateral breast MRI, given the slightly thickened skin of the Right breast, and heterogeneously dense breasts. Pathology results reported by Terie Purser, RN on 03/19/2019. Electronically Signed   By: Curlene Dolphin M.D.   On: 03/19/2019 13:23   Result Date: 03/21/2019 CLINICAL DATA:  Ultrasound-guided core needle biopsy left recommended for suspicious palpable mass 9:30 position right breast. EXAM: ULTRASOUND GUIDED RIGHT BREAST CORE NEEDLE BIOPSY COMPARISON:  Previous exam(s). FINDINGS: I met with the patient and  we discussed the procedure of ultrasound-guided biopsy, including benefits and alternatives. We discussed the high likelihood of a successful procedure. We discussed the risks of the procedure, including infection, bleeding, tissue injury, clip migration, and inadequate sampling. Informed written consent was given. The usual time-out protocol was performed immediately prior to the procedure. Lesion quadrant: Upper outer quadrant Using sterile technique and 1% Lidocaine as local anesthetic, under direct ultrasound visualization, a 12 gauge spring-loaded device was used to perform biopsy of a suspicious palpable mass right breast 9:30 position using a lateral approach. At the conclusion of the procedure a heart shaped tissue marker clip was deployed into the biopsy cavity. Follow up 2 view mammogram was performed and dictated separately. IMPRESSION: Ultrasound guided biopsy of the right breast. No apparent complications. Electronically Signed: By: Curlene Dolphin M.D. On: 03/17/2019 16:59      IMPRESSION/PLAN: Right breast cancer  She will start lumpectomy tamoxifen before surgery to bridge the gap until this procedure.  She will determine with Dr. Donne Hazel whether she is  a good candidate for lumpectomy.  After surgery it will be determined if she needs chemotherapy based on personalized testing of the tissue.  If she receives chemotherapy this will occur before radiotherapy.  She will certainly be good candidate for radiotherapy if she undergoes breast conserving surgery.  If she undergoes a mastectomy is not yet clear whether she will need radiotherapy.  This will depend upon her final pathology.  It was a pleasure meeting the patient today.  We discussed that radiation would take approximately 6 weeks to complete and that I would give the patient a few weeks to heal following surgery before starting treatment planning.  If chemotherapy were to be given, this would precede radiotherapy. We spoke about acute effects including skin irritation and fatigue as well as much less common late effects including internal organ injury or irritation. We spoke about the latest technology that is used to minimize the risk of late effects for patients undergoing radiotherapy to the breast or chest wall. No guarantees of treatment were given. The patient is enthusiastic about proceeding with treatment. I look forward to participating in the patient's care.  I will await her referral back to me for postoperative follow-up and eventual CT simulation/treatment planning.   __________________________________________   Eppie Gibson, MD  =

## 2019-03-26 NOTE — Patient Instructions (Signed)

## 2019-03-26 NOTE — Progress Notes (Signed)
REF[place]ING [place]: [place] [address] [place],  [place] [address]  PRIMARY PROVIDER:  Kathyrn Lass, MD  PRIMARY REASON FOR VISIT:  1. Malignant neoplasm of upper-outer quadrant of right breast in female, estrogen receptor positive ([place])   2. Family history of uterine cancer     I connected with Krystal Davis on 03/26/2019 at 12:00 PM EDT by Webex and verified that I am speaking with the correct person using two identifiers.    Patient location: [facility] Provider location: clinic   HISTORY OF PRESENT ILLNESS:   Krystal Davis, a 48 y.o. female, was seen for a [place] cancer genetics consultation at the request of Dr. Lindi Adie due to a personal and family history of cancer.  Krystal Davis presents to clinic today to discuss the possibility of a hereditary predisposition to cancer, genetic testing, and to further clarify her future cancer risks, as well as potential cancer risks for family members.   In 2020, at the age of 54, Krystal Davis was diagnosed with IDC of the right breast, ER/PR+, Her2-. The treatment plan includes surgery, possible chemotherapy, adjuvant radiation and adjuvant antiestrogen therapy.  CANCER HISTORY:  Oncology History  Malignant neoplasm of upper-outer quadrant of right breast in female, estrogen receptor positive ([place])  03/25/2019 Initial Diagnosis   Patient palpated right breast lump. Diagnostic mammogram showed 3.4cm mass in the UOQ and one right axillary lymph node with cortical thickening measuring 3.11m. 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   03/26/2019 Cancer Staging   Staging form: Breast, AJCC 8th Edition - Clinical stage from 03/26/2019: Stage IB (cT2, cN0, cM0, G1, ER+, PR+, HER2-) - Signed by GNicholas Lose MD on 03/26/2019      RISK FACTORS:  Menarche was at age 48  First live birth at age 48  OCP use for approximately 0 years.  Ovaries intact: yes.   Hysterectomy: no.  Mammogram within the last year: yes.  Past Medical History:  Diagnosis Date  . Abnormal Pap smear of vagina   . Family history of uterine cancer   . Low back pain   . Low HDL (under 40)   . Rectal bleeding     Past Surgical History:  Procedure Laterality Date  . CESAREAN SECTION    . HEMORRHOID SURGERY    . TONSILLECTOMY    . TONSILLITIS      Social History   Socioeconomic History  . Marital status: Married    Spouse name: Not on file  . Number of children: 1  . Years of education: Not on file  . Highest education level: Not on file  Occupational History  . Occupation: IMaterials engineer Social Needs  . Financial resource strain: Not on file  . Food insecurity    Worry: Not on file    Inability: Not on file  . Transportation needs    Medical: Not on file    Non-medical: Not on file  Tobacco Use  . Smoking status: Never Smoker  . Smokeless tobacco: Never Used  Substance and Sexual Activity  . Alcohol use: No  . Drug use: No  . Sexual activity: Not on file  Lifestyle  . Physical activity    Days per week: Not on file    Minutes per session: Not on file  . Stress: Not on file  Relationships  . Social [place] phone: Not  on file    Gets together: Not on file    Attends religious service: Not on file    Active member of club or organization: Not on file    Attends meetings of clubs or organizations: Not on file    Relationship status: Not on file  Other Topics Concern  . Not on file  Social History Narrative  . Not on file     FAMILY HISTORY:  We obtained a detailed, 4-generation family history.  Significant diagnoses are listed below: Family History  Problem Relation Age of Onset  . Cancer Mother 59       Uterine and Endometrial  . Hypertension Father   . CVA Father   . Heart attack Father     Krystal Davis has one daughter, age 44, no history of cancer. She has one sister, 8, no cancer diagnoses.  Krystal Davis mother  was diagnosed with uterine cancer at 74. She had a hysterectomy and then had a recurrence, and passed away at 31. The patient has 2 maternal aunts, 3 maternal uncles, no cancers. No known cancers in maternal cousins. Her maternal grandparents passed in their 50s.  Krystal Davis father died at 36, no cancers. Patient had 2 paternal uncles, no known cancers. No cancers in paternal cousins. Her paternal grandparents passed in their late 10s.   Krystal Davis is unaware of previous family history of genetic testing for hereditary cancer risks.There is no reported Ashkenazi Jewish ancestry. There is no known consanguinity.  GENETIC COUNSELING ASSESSMENT: Krystal Davis is a 48 y.o. female with a personal and family history which is somewhat suggestive of a hereditary cancer syndrome and predisposition to cancer. We, therefore, discussed and recommended the following at today's visit.   DISCUSSION: We discussed that 5 - 10% of breast is hereditary, with most cases associated with BRCA1/BRCA2 mutations.  There are other genes that can be associated with hereditary breast cancer syndromes.  These include PALB2, ATM, CHEK2.   We discussed that testing is beneficial for several reasons including surgical decision-making for breast cancer, knowing how to follow individuals after completing their treatment, and understand if other family members could be at risk for cancer and allow them to undergo genetic testing.   We reviewed the characteristics, features and inheritance patterns of hereditary cancer syndromes. We also discussed genetic testing, including the appropriate family members to test, the process of testing, insurance coverage and turn-around-time for results. We discussed the implications of a negative, positive and/or variant of uncertain significant result. In order to get genetic test results in a timely manner so that Krystal Davis can use these genetic test results for surgical decisions, we recommended Krystal Davis  pursue genetic testing for the Breast Cancer STAT Panel. Once complete, we recommend Krystal Davis pursue reflex genetic testing to the Common Hereditary Cancers gene panel.   The STAT Breast cancer panel offered by [facility] includes sequencing and rearrangement analysis for the following 9 genes:  ATM, BRCA1, BRCA2, CDH1, CHEK2, PALB2, PTEN, STK11 and TP53.    The Common Hereditary Cancers Panel offered by [facility] includes sequencing and/or deletion duplication testing of the following 48 genes: APC, ATM, AXIN2, BARD1, BMPR1A, BRCA1, BRCA2, BRIP1, CDH1, CDKN2A (p14ARF), CDKN2A (p16INK4a), CKD4, CHEK2, CTNNA1, DICER1, EPCAM (Deletion/duplication testing only), GREM1 (promoter region deletion/duplication testing only), KIT, MEN1, MLH1, MSH2, MSH3, MSH6, MUTYH, NBN, NF1, NHTL1, PALB2, PDGFRA, PMS2, POLD1, POLE, PTEN, RAD50, RAD51C, RAD51D, RNF43, SDHB, SDHC, SDHD, SMAD4, SMARCA4. STK11, TP53, TSC1, TSC2, and VHL.  The  following genes were evaluated for sequence changes only: SDHA and HOXB13 c.251G>A variant only.  Based on Krystal Davis's personal and family history of cancer, she meets medical criteria for genetic testing. Despite that she meets criteria, she may still have an out of pocket cost.   PLAN: After considering the risks, benefits, and limitations, Krystal Davis provided informed consent to pursue genetic testing and the blood sample was sent to [facility] for analysis of the Breast Cancer STAT Panel + Common Hereditary Cancers Panel. Initial esults should be available within approximately 5-12 days' time, at which point they will be disclosed by telephone to Krystal Davis, as will any additional recommendations warranted by these results. Krystal Davis will receive a summary of her genetic counseling visit and a copy of her results once available. This information will also be available in Epic.   Lastly, we encouraged Krystal Davis to remain in contact with cancer genetics annually so that we can continuously  update the family history and inform her of any changes in cancer genetics and testing that may be of benefit for this family.   Krystal Davis questions were answered to her satisfaction today. Our contact information was provided should additional questions or concerns arise. Thank you for the referral and allowing [place] to share in the care of your patient.   Faith Rogue, MS, [place] Genetic Counselor [email or web address] Phone: [phone]  The patient was seen for a total of 15 minutes in virtual genetic counseling.  Drs. Magrinat, Lindi Adie and/or Burr Medico were available for discussion regarding this case.   _______________________________________________________________________ For Office Staff:  Number of people involved in session: 1 Was an Intern/ student involved with case: no

## 2019-04-01 ENCOUNTER — Telehealth: Payer: Self-pay | Admitting: *Deleted

## 2019-04-01 ENCOUNTER — Other Ambulatory Visit: Payer: Self-pay

## 2019-04-01 ENCOUNTER — Ambulatory Visit (HOSPITAL_COMMUNITY)
Admission: RE | Admit: 2019-04-01 | Discharge: 2019-04-01 | Disposition: A | Discharge status: Home / Self Care | Payer: BC Managed Care – PPO | Source: Ambulatory Visit | Attending: General Surgery | Admitting: General Surgery

## 2019-04-01 ENCOUNTER — Encounter (HOSPITAL_COMMUNITY): Payer: Self-pay | Admitting: Radiology

## 2019-04-01 ENCOUNTER — Encounter (HOSPITAL_COMMUNITY): Payer: Self-pay

## 2019-04-01 DIAGNOSIS — Z170 Estrogen receptor positive status [ER+]: Secondary | ICD-10-CM | POA: Diagnosis present

## 2019-04-01 DIAGNOSIS — C50411 Malignant neoplasm of upper-outer quadrant of right female breast: Secondary | ICD-10-CM | POA: Diagnosis not present

## 2019-04-01 MED ORDER — GADOBUTROL 1 MMOL/ML IV SOLN
7.0000 mL | Freq: Once | INTRAVENOUS | Status: AC | PRN
  Administered 2019-04-01: 7 mL via INTRAVENOUS

## 2019-04-01 NOTE — Telephone Encounter (Signed)
Spoke to pt concerning [place] from 7.29/20. Denies questions or concerns regarding dx or treatment care plan. Pt informed she had a rxn from IV contrast during MRI. Msg sent to Dr. Lindi Adie regarding ordering prep. Encourage pt to call with needs. Received verbal understanding.

## 2019-04-02 ENCOUNTER — Ambulatory Visit: Payer: Self-pay | Admitting: Licensed Clinical Social Worker

## 2019-04-02 ENCOUNTER — Telehealth: Payer: Self-pay | Admitting: *Deleted

## 2019-04-02 ENCOUNTER — Encounter: Payer: Self-pay | Admitting: Licensed Clinical Social Worker

## 2019-04-02 ENCOUNTER — Telehealth: Payer: Self-pay | Admitting: Licensed Clinical Social Worker

## 2019-04-02 ENCOUNTER — Telehealth: Payer: Self-pay

## 2019-04-02 DIAGNOSIS — Z1379 Encounter for other screening for genetic and chromosomal anomalies: Secondary | ICD-10-CM

## 2019-04-02 DIAGNOSIS — Z170 Estrogen receptor positive status [ER+]: Secondary | ICD-10-CM

## 2019-04-02 DIAGNOSIS — C50411 Malignant neoplasm of upper-outer quadrant of right female breast: Secondary | ICD-10-CM

## 2019-04-02 DIAGNOSIS — Z8049 Family history of malignant neoplasm of other genital organs: Secondary | ICD-10-CM

## 2019-04-02 MED ORDER — PREDNISONE 50 MG PO TABS: ORAL_TABLET | ORAL | 0 refills | Status: DC

## 2019-04-02 NOTE — Telephone Encounter (Signed)
Nutrition Assessment  Reason for Assessment:  Pt attended Breast Clinic on 7/29 and received nutrition packet from nurse navigator.  ASSESSMENT:   48 year old female with right invasive ductal breast cancer.  Planning surgery lumpectomy vs mastectomy, oncotype, adjuvant radiation and adjuvant antiestrogen therapy. Past medical history reviewed.  Spoke with patient via phone to introduce self and service at [facility].  Patient reports normal appetite and stable weight  Medications:  reviewed  Labs: reviewed  Anthropometrics:   Height: 66 inches Weight: 170 lb BMI: 27   NUTRITION DIAGNOSIS: Food and nutrition related knowledge deficit related to new diagnosis of breast cancer as evidenced by no prior need for nutrition related information.  INTERVENTION:   Discussed briefly packet of information regarding nutritional tips for breast cancer patients.  Questions answered.  Contact information provided and patient knows to contact me with questions/concerns.     MONITORING, EVALUATION, and GOAL: Pt will consume a healthy plant based diet to maintain lean body mass throughout treatment.   Krystal Davis, [place], [place] Registered Dietitian [phone] (pager)

## 2019-04-02 NOTE — Telephone Encounter (Signed)
Per Dr. Lindi Adie ordered MRI prep for breast MRI on 8/7. Called pt with instructions. Received verbal understanding. Prednisone prescription sent to pharmacy.  Denies further questions or needs at this time.

## 2019-04-02 NOTE — Telephone Encounter (Signed)
Revealed negative genetic testing.  Revealed that 3 variants of uncertain significance were identified: one in the POLE gene, one in the SDHA gene, and one in the [facility] gene. This normal result is reassuring and indicates that it is unlikely Krystal Davis's cancer is due to a hereditary cause.  It is unlikely that there is an increased risk of another cancer due to a mutation in one of these genes.  However, genetic testing is not perfect, and cannot definitively rule out a hereditary cause.  It will be important for her to keep in contact with genetics to learn if any additional testing may be needed in the future.

## 2019-04-02 NOTE — Progress Notes (Signed)
HPI:  Ms. Boord was previously seen in the [place] clinic due to a personal and family history of cancer and concerns regarding a hereditary predisposition to cancer. Please refer to our prior cancer genetics clinic note for more information regarding our discussion, assessment and recommendations, at the time. Ms. Kehres recent genetic test results were disclosed to her, as were recommendations warranted by these results. These results and recommendations are discussed in more detail below.  CANCER HISTORY:  Oncology History  Malignant neoplasm of upper-outer quadrant of right breast in female, estrogen receptor positive ([place])  03/25/2019 Initial Diagnosis   Patient palpated right breast lump. Diagnostic mammogram showed 3.4cm mass in the UOQ and one right axillary lymph node with cortical thickening measuring 3.77m. 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   03/26/2019 Cancer Staging   Staging form: Breast, AJCC 8th Edition - Clinical stage from 03/26/2019: Stage IB (cT2, cN0, cM0, G1, ER+, PR+, HER2-) - Signed by GNicholas Lose MD on 03/26/2019    Genetic Testing   3 Variants of Uncertain Significance (VUS) identified: one in the POLE gene called c.3762C>G, one in the SDHA gene called c.263C>G, and one in [facility] called c.2176C>T. The STAT Breast cancer panel offered by [facility] includes sequencing and rearrangement analysis for the following 9 genes:  ATM, BRCA1, BRCA2, CDH1, CHEK2, PALB2, PTEN, STK11 and TP53.  The Common Hereditary Cancers Panel offered by [facility] includes sequencing and/or deletion duplication testing of the following 47 genes: APC, ATM, AXIN2, BARD1, BMPR1A, BRCA1, BRCA2, BRIP1, CDH1, CDKN2A (p14ARF), CDKN2A (p16INK4a), CKD4, CHEK2, CTNNA1, DICER1, EPCAM (Deletion/duplication testing only), GREM1 (promoter region deletion/duplication testing only), KIT, MEN1, MLH1, MSH2, MSH3,  MSH6, MUTYH, NBN, NF1, NHTL1, PALB2, PDGFRA, PMS2, POLD1, POLE, PTEN, RAD50, RAD51C, RAD51D,SDHB, SDHC, SDHD, SMAD4, SMARCA4. STK11, TP53, TSC1, TSC2, and VHL.  The following genes were evaluated for sequence changes only: SDHA and HOXB13 c.251G>A variant only. Report date is 04/02/2019.     FAMILY HISTORY:  We obtained a detailed, 4-generation family history.  Significant diagnoses are listed below: Family History  Problem Relation Age of Onset   Cancer Mother 465      Uterine and Endometrial   Hypertension Father    CVA Father    Heart attack Father      Ms. MDrennonhas one daughter, age 48 no history of cancer. She has one sister, 435 no cancer diagnoses.  Ms. MTassomother was diagnosed with uterine cancer at 473 She had a hysterectomy and then had a recurrence, and passed away at 599 The patient has 2 maternal aunts, 3 maternal uncles, no cancers. No known cancers in maternal cousins. Her maternal grandparents passed in their 685s  Ms. MBauerfather died at 578 no cancers. Patient had 2 paternal uncles, no known cancers. No cancers in paternal cousins. Her paternal grandparents passed in their late 738s   Ms. MBasleyis unaware of previous family history of genetic testing for hereditary cancer risks.There is no reported Ashkenazi Jewish ancestry. There is no known consanguinity.  GENETIC TEST RESULTS: Genetic testing reported out on 04/02/2019 through the [facility] Breast Cancer STAT Panel + Common Hereditary  cancer panel found no pathogenic mutations. The STAT Breast cancer panel offered by [facility] includes sequencing and rearrangement analysis for the following 9 genes:  ATM, BRCA1, BRCA2, CDH1, CHEK2, PALB2, PTEN, STK11 and TP53.  The Common Hereditary Cancers Panel  offered by [facility] includes sequencing and/or deletion duplication testing of the following 47 genes: APC, ATM, AXIN2, BARD1, BMPR1A, BRCA1, BRCA2, BRIP1, CDH1, CDKN2A (p14ARF), CDKN2A (p16INK4a), CKD4, CHEK2, CTNNA1,  DICER1, EPCAM (Deletion/duplication testing only), GREM1 (promoter region deletion/duplication testing only), KIT, MEN1, MLH1, MSH2, MSH3, MSH6, MUTYH, NBN, NF1, NHTL1, PALB2, PDGFRA, PMS2, POLD1, POLE, PTEN, RAD50, RAD51C, RAD51D,  SDHB, SDHC, SDHD, SMAD4, SMARCA4. STK11, TP53, TSC1, TSC2, and VHL.  The following genes were evaluated for sequence changes only: SDHA and HOXB13 c.251G>A variant only. The test report has been scanned into EPIC and is located under the Molecular Pathology section of the Results Review tab.  A portion of the result report is included below for reference.     We discussed with Ms. Lieder that because current genetic testing is not perfect, it is possible there may be a gene mutation in one of these genes that current testing cannot detect, but that chance is small.  We also discussed, that there could be another gene that has not yet been discovered, or that we have not yet tested, that is responsible for the cancer diagnoses in the family. It is also possible there is a hereditary cause for the cancer in the family that Ms. Mcwethy did not inherit and therefore was not identified in her testing.  Therefore, it is important to remain in touch with cancer genetics in the future so that we can continue to offer Ms. Bamba the most up to date genetic testing.   Genetic testing did identify 3 Variants of uncertain significance (VUS) - one in the POLE gene called c.3762C>G, a second in the [facility] gene called c.263C>G, and a third in the [facility] gene called c.2176C>T.  At this time, it is unknown if these variants are associated with increased cancer risk or if they are normal findings, but most variants such as these get reclassified to being inconsequential. They should not be used to make medical management decisions. With time, we suspect the lab will determine the significance of these variants, if any. If we do learn more about them, we will try to contact Ms. Steichen to discuss it  further. However, it is important to stay in touch with [place] periodically and keep the address and phone number up to date.  ADDITIONAL GENETIC TESTING: We discussed with Ms. Sabatino that her genetic testing was fairly extensive.  If there are genes identified to increase cancer risk that can be analyzed in the future, we would be happy to discuss and coordinate this testing at that time.    CANCER SCREENING RECOMMENDATIONS: Ms. Gotwalt test result is considered negative (normal).  This means that we have not identified a hereditary cause for her  personal and family history of cancer at this time. Most cancers happen by chance and this negative test suggests that her cancer may fall into this category.    While reassuring, this does not definitively rule out a hereditary predisposition to cancer. It is still possible that there could be genetic mutations that are undetectable by current technology. There could be genetic mutations in genes that have not been tested or identified to increase cancer risk.  Therefore, it is recommended she continue to follow the cancer management and screening guidelines provided by her oncology and primary healthcare provider.   An individual's cancer risk and medical management are not determined by genetic test results alone. Overall cancer risk assessment incorporates additional factors, including personal medical history, family history, and any available genetic information  that may result in a personalized plan for cancer prevention and surveillance.  RECOMMENDATIONS FOR FAMILY MEMBERS:  Relatives in this family might be at some increased risk of developing cancer, over the general population risk, simply due to the family history of cancer.  We recommended female relatives in this family have a yearly mammogram beginning at age 35, or 73 years younger than the earliest onset of cancer, an annual clinical breast exam, and perform monthly breast self-exams. Female  relatives in this family should also have a gynecological exam as recommended by their primary provider. All family members should have a colonoscopy by age 58, or as directed by their physicians.   It is also possible there is a hereditary cause for the cancer in Ms. Palen's family that she did not inherit and therefore was not identified in her.  Based on Ms. Bilger's family history, we recommended maternal relatives have genetic counseling and testing. Ms. Aloi will let us know if we can be of any assistance in coordinating genetic counseling and/or testing for these family members.  FOLLOW-UP: Lastly, we discussed with Ms. Deandrade that cancer genetics is a rapidly advancing field and it is possible that new genetic tests will be appropriate for her and/or her family members in the future. We encouraged her to remain in contact with cancer genetics on an annual basis so we can update her personal and family histories and let her know of advances in cancer genetics that may benefit this family.   Our contact number was provided. Ms. Tseng questions were answered to her satisfaction, and she knows she is welcome to call us at anytime with additional questions or concerns.   Faith Rogue, MS, [facility] Genetic Counselor [email or web address] Phone: [phone]

## 2019-04-04 ENCOUNTER — Ambulatory Visit (HOSPITAL_COMMUNITY)
Admission: RE | Admit: 2019-04-04 | Discharge: 2019-04-04 | Disposition: A | Discharge status: Home / Self Care | Payer: BC Managed Care – PPO | Source: Ambulatory Visit | Attending: General Surgery | Admitting: General Surgery

## 2019-04-04 ENCOUNTER — Other Ambulatory Visit: Payer: Self-pay

## 2019-04-04 DIAGNOSIS — C50411 Malignant neoplasm of upper-outer quadrant of right female breast: Secondary | ICD-10-CM | POA: Diagnosis not present

## 2019-04-04 MED ORDER — GADOBUTROL 1 MMOL/ML IV SOLN
7.5000 mL | Freq: Once | INTRAVENOUS | Status: AC | PRN
  Administered 2019-04-04: 7.5 mL via INTRAVENOUS

## 2019-04-08 ENCOUNTER — Other Ambulatory Visit: Payer: Self-pay | Admitting: General Surgery

## 2019-04-08 DIAGNOSIS — N6320 Unspecified lump in the left breast, unspecified quadrant: Secondary | ICD-10-CM

## 2019-04-08 DIAGNOSIS — N6310 Unspecified lump in the right breast, unspecified quadrant: Secondary | ICD-10-CM

## 2019-04-09 ENCOUNTER — Other Ambulatory Visit: Payer: Self-pay

## 2019-04-09 ENCOUNTER — Ambulatory Visit
Admission: RE | Admit: 2019-04-09 | Discharge: 2019-04-09 | Disposition: A | Discharge status: Home / Self Care | Payer: BC Managed Care – PPO | Source: Ambulatory Visit | Attending: General Surgery | Admitting: General Surgery

## 2019-04-09 ENCOUNTER — Other Ambulatory Visit: Payer: BC Managed Care – PPO

## 2019-04-09 DIAGNOSIS — N6320 Unspecified lump in the left breast, unspecified quadrant: Secondary | ICD-10-CM

## 2019-04-09 DIAGNOSIS — N6310 Unspecified lump in the right breast, unspecified quadrant: Secondary | ICD-10-CM

## 2019-04-10 ENCOUNTER — Other Ambulatory Visit: Payer: Self-pay | Admitting: General Surgery

## 2019-04-10 DIAGNOSIS — R9389 Abnormal findings on diagnostic imaging of other specified body structures: Secondary | ICD-10-CM

## 2019-04-11 ENCOUNTER — Encounter: Payer: Self-pay | Admitting: Licensed Clinical Social Worker

## 2019-04-11 ENCOUNTER — Telehealth: Payer: Self-pay | Admitting: Licensed Clinical Social Worker

## 2019-04-11 NOTE — Telephone Encounter (Signed)
Patient wanted to update her family history. She told us initially her mother had uterine cancer with a recurrence in her endometrial lining. She found out that it was actually ovarian cancer initially, then peritoneal cancer later on. I will update family history in chart and pedigree.

## 2019-04-14 ENCOUNTER — Ambulatory Visit
Admission: RE | Admit: 2019-04-14 | Discharge: 2019-04-14 | Disposition: A | Discharge status: Home / Self Care | Payer: BC Managed Care – PPO | Source: Ambulatory Visit | Attending: General Surgery | Admitting: General Surgery

## 2019-04-14 ENCOUNTER — Other Ambulatory Visit: Payer: Self-pay

## 2019-04-14 DIAGNOSIS — R9389 Abnormal findings on diagnostic imaging of other specified body structures: Secondary | ICD-10-CM

## 2019-04-14 MED ORDER — GADOBUTROL 1 MMOL/ML IV SOLN
8.0000 mL | Freq: Once | INTRAVENOUS | Status: AC | PRN
  Administered 2019-04-14: 8 mL via INTRAVENOUS

## 2019-04-15 ENCOUNTER — Encounter: Payer: Self-pay | Admitting: *Deleted

## 2019-04-17 ENCOUNTER — Other Ambulatory Visit: Payer: Self-pay | Admitting: Hematology and Oncology

## 2019-04-18 ENCOUNTER — Other Ambulatory Visit: Payer: Self-pay | Admitting: General Surgery

## 2019-04-18 DIAGNOSIS — Z170 Estrogen receptor positive status [ER+]: Secondary | ICD-10-CM

## 2019-04-18 DIAGNOSIS — C50411 Malignant neoplasm of upper-outer quadrant of right female breast: Secondary | ICD-10-CM

## 2019-04-21 ENCOUNTER — Encounter: Payer: Self-pay | Admitting: *Deleted

## 2019-04-21 NOTE — Progress Notes (Signed)
Clinical Social Work [place] Psychosocial Distress Screening [place]  Patient completed distress screening protocol and scored a 5 on the Psychosocial Distress Thermometer which indicates moderate distress. Clinical Social Worker contacted patient at home after [facility] to assess for distress and other psychosocial needs. Patient stated she was feeling better after meeting with the treatment team and getting more information on her treatment plan. CSW and patient discussed common feeling and emotions when being diagnosed with cancer, and the importance of support during treatment. CSW informed patient of the support team and support services at [facility]. CSW provided contact information and encouraged patient to call with any questions or concerns.  ONCBCN DISTRESS SCREENING 04/21/2019  Screening Type Initial Screening  Distress experienced in past week (1-10) 5  Practical problem type Work/school  Emotional problem type Adjusting to illness  Information Concerns Type Lack of info about treatment;Lack of info about complementary therapy choices     Johnnye Lana, MSW, LCSW, OSW-C Clinical Social Worker [place] [phone]

## 2019-04-28 ENCOUNTER — Other Ambulatory Visit: Payer: Self-pay | Admitting: General Surgery

## 2019-04-28 ENCOUNTER — Telehealth: Payer: Self-pay | Admitting: Hematology and Oncology

## 2019-04-28 DIAGNOSIS — Z170 Estrogen receptor positive status [ER+]: Secondary | ICD-10-CM

## 2019-04-28 DIAGNOSIS — C50411 Malignant neoplasm of upper-outer quadrant of right female breast: Secondary | ICD-10-CM

## 2019-04-28 NOTE — Telephone Encounter (Signed)
I talk with patient regarding schedule  

## 2019-05-09 NOTE — Progress Notes (Signed)
[facility], [place] - [address] [place] [address] Phone: [phone] Fax: [phone]      Your procedure is scheduled on Friday, 05/16/2019.  Report to [facility] Main Entrance "A" at 08:45 A.M., and check in at the Admitting office.  Call this number if you have problems the morning of surgery:  [phone]  Call [phone] if you have any questions prior to your surgery date Monday-Friday 8am-4pm    Remember:  Do not eat after midnight the night before your surgery  You may drink clear liquids until 07:45am the morning of your surgery.   Clear liquids allowed are: Water, Non-Citrus Juices (without pulp), Carbonated Beverages, Clear Tea, Black Coffee Only, and Gatorade    Take these medicines the morning of surgery with A SIP OF WATER: Simvastatin (Zocor)  7 days prior to surgery STOP taking any Aspirin (unless otherwise instructed by your surgeon), Aleve, Naproxen, Ibuprofen, Motrin, Advil, Goody's, BC's, all herbal medications, fish oil, and all vitamins.   WHAT DO I DO ABOUT MY DIABETES MEDICATION?  . DO NOT take Metformin (Glucophage) the morning of surgery.   How to Manage Your Diabetes Before and After Surgery  Why is it important to control my blood sugar before and after surgery? . Improving blood sugar levels before and after surgery helps healing and can limit problems. . A way of improving blood sugar control is eating a healthy diet by: o  Eating less sugar and carbohydrates o  Increasing activity/exercise o  Talking with your doctor about reaching your blood sugar goals . High blood sugars (greater than 180 mg/dL) can raise your risk of infections and slow your recovery, so you will need to focus on controlling your diabetes during the weeks before surgery. . Make sure that the doctor who takes care of your diabetes knows about your planned surgery including the date and location.  How do I manage my  blood sugar before surgery? . Check your blood sugar at least 4 times a day, starting 2 days before surgery, to make sure that the level is not too high or low. o Check your blood sugar the morning of your surgery when you wake up and every 2 hours until you get to the Short Stay unit. . If your blood sugar is less than 70 mg/dL, you will need to treat for low blood sugar: o Do not take insulin. o Treat a low blood sugar (less than 70 mg/dL) with  cup of clear juice (cranberry or apple), 4 glucose tablets, OR glucose gel. o Recheck blood sugar in 15 minutes after treatment (to make sure it is greater than 70 mg/dL). If your blood sugar is not greater than 70 mg/dL on recheck, call [phone] for further instructions. . Report your blood sugar to the short stay nurse when you get to Short Stay.  . If you are admitted to the hospital after surgery: o Your blood sugar will be checked by the staff and you will probably be given insulin after surgery (instead of oral diabetes medicines) to make sure you have good blood sugar levels. o The goal for blood sugar control after surgery is 80-180 mg/dL.   The Morning of Surgery  Do not wear jewelry, make-up or nail polish.  Do not wear lotions, powders, or perfumes/colognes, or deodorant  Do not shave 48 hours prior to surgery.  Men may shave face and neck.  Do not bring valuables to the hospital.  [facility] is not responsible for any belongings or valuables.  IF you are a smoker, DO NOT Smoke 24 hours prior to surgery  IF you wear a CPAP at night please bring your mask, tubing, and machine the morning of surgery   Remember that you must have someone to transport you home after your surgery, and remain with you for 24 hours if you are discharged the same day.   Contacts, eyeglasses, hearing aids, dentures or bridgework may not be worn into surgery.    Leave your suitcase in the car.  After surgery it may be brought to your room.  For  patients admitted to the hospital, discharge time will be determined by your treatment team.  Patients discharged the day of surgery will not be allowed to drive home.    Special instructions:   [place]- Preparing For Surgery  Before surgery, you can play an important role. Because skin is not sterile, your skin needs to be as free of germs as possible. You can reduce the number of germs on your skin by washing with CHG (chlorahexidine gluconate) Soap before surgery.  CHG is an antiseptic cleaner which kills germs and bonds with the skin to continue killing germs even after washing.    Oral Hygiene is also important to reduce your risk of infection.  Remember - BRUSH YOUR TEETH THE MORNING OF SURGERY WITH YOUR REGULAR TOOTHPASTE  Please do not use if you have an allergy to CHG or antibacterial soaps. If your skin becomes reddened/irritated stop using the CHG.  Do not shave (including legs and underarms) for at least 48 hours prior to first CHG shower. It is OK to shave your face.  Please follow these instructions carefully.   1. Shower the NIGHT BEFORE SURGERY and the MORNING OF SURGERY with CHG Soap.   2. If you chose to wash your hair, wash your hair first as usual with your normal shampoo.  3. After you shampoo, rinse your hair and body thoroughly to remove the shampoo.  4. Use CHG as you would any other liquid soap. You can apply CHG directly to the skin and wash gently with a scrungie or a clean washcloth.   5. Apply the CHG Soap to your body ONLY FROM THE NECK DOWN.  Do not use on open wounds or open sores. Avoid contact with your eyes, ears, mouth and genitals (private parts). Wash Face and genitals (private parts)  with your normal soap.   6. Wash thoroughly, paying special attention to the area where your surgery will be performed.  7. Thoroughly rinse your body with warm water from the neck down.  8. DO NOT shower/wash with your normal soap after using and rinsing off the  CHG Soap.  9. Pat yourself dry with a CLEAN TOWEL.  10. Wear CLEAN PAJAMAS to bed the night before surgery, wear comfortable clothes the morning of surgery  11. Place CLEAN SHEETS on your bed the night of your first shower and DO NOT SLEEP WITH PETS.    Day of Surgery:  Please shower the morning of surgery with the CHG soap  Do not apply any deodorants/lotions. Please wear clean clothes to the hospital/surgery center.   Remember to brush your teeth WITH YOUR REGULAR TOOTHPASTE.   Please read over the following fact sheets that you were given.

## 2019-05-09 NOTE — Progress Notes (Addendum)
Your procedure is scheduled on Friday, 05/16/2019.  Report to [facility] Main Entrance "A" at 08:45 A.M., and check in at the Admitting office.             Your surgery or procedure is scheduled for 10:45 AM  Call this number if you have problems the morning of surgery:  [phone]  Call [phone] if you have any questions prior to your surgery date Monday-Friday 8am-4pm    Remember:  Do not eat after midnight the night before your surgery  You may drink clear liquids until 07:45am the morning of your surgery.   Clear liquids allowed are: Water, Non-Citrus Juices (without pulp), Carbonated Beverages, Clear Tea, Black Coffee Only, and Gatorade  Drink G2 that you were given between 7:15 AM and 7:45 AM   Take these medicines the morning of surgery with A SIP OF WATER: Simvastatin (Zocor) Tamoxifen (Nolvaderl)  7 days prior to surgery STOP taking any Aspirin (unless otherwise instructed by your surgeon), Aleve, Naproxen, Ibuprofen, Motrin, Advil, Goody's, BC's, all herbal medications, fish oil, and all vitamins.   WHAT DO I DO ABOUT MY DIABETES MEDICATION?  . DO NOT take Metformin (Glucophage) the morning of surgery.   How to Manage Your Diabetes Before and After Surgery  Why is it important to control my blood sugar before and after surgery? . Improving blood sugar levels before and after surgery helps healing and can limit problems. . A way of improving blood sugar control is eating a healthy diet by: o  Eating less sugar and carbohydrates o  Increasing activity/exercise o  Talking with your doctor about reaching your blood sugar goals . High blood sugars (greater than 180 mg/dL) can raise your risk of infections and slow your recovery, so you will need to focus on controlling your diabetes during the weeks before surgery. . Make sure that the doctor who takes care of your diabetes knows about your planned surgery including the date and location.  How do I manage my  blood sugar before surgery? . Check your blood sugar at least 4 times a day, starting 2 days before surgery, to make sure that the level is not too high or low. o Check your blood sugar the morning of your surgery when you wake up and every 2 hours until you get to the Short Stay unit. . If your blood sugar is less than 70 mg/dL, you will need to treat for low blood sugar: o Do not take insulin. o Treat a low blood sugar (less than 70 mg/dL) with  cup of clear juice (cranberry or apple), 4 glucose tablets, OR glucose gel. o Recheck blood sugar in 15 minutes after treatment (to make sure it is greater than 70 mg/dL). If your blood sugar is not greater than 70 mg/dL on recheck, call [phone] for further instructions. . Report your blood sugar to the short stay nurse when you get to Short Stay.  . If you are admitted to the hospital after surgery: o Your blood sugar will be checked by the staff and you will probably be given insulin after surgery (instead of oral diabetes medicines) to make sure you have good blood sugar levels. o The goal for blood sugar control after surgery is 80-180 mg/dL.  Special instructions:   [place]- Preparing For Surgery  Before surgery, you can play an important role. Because skin is not sterile, your skin needs to be as free of germs as possible. You can reduce  the number of germs on your skin by washing with CHG (chlorahexidine gluconate) Soap before surgery.  CHG is an antiseptic cleaner which kills germs and bonds with the skin to continue killing germs even after washing.    Oral Hygiene is also important to reduce your risk of infection.  Remember - BRUSH YOUR TEETH THE MORNING OF SURGERY WITH YOUR REGULAR TOOTHPASTE  Please do not use if you have an allergy to CHG or antibacterial soaps. If your skin becomes reddened/irritated stop using the CHG.  Do not shave (including legs and underarms) for at least 48 hours prior to first CHG shower. It is OK to  shave your face.  Please follow these instructions carefully.   1. Shower the NIGHT BEFORE SURGERY and the MORNING OF SURGERY with CHG Soap.   2. If you chose to wash your hair, wash your hair first as usual with your normal shampoo.  3. After you shampoo, wash your face and private area with the soap you use at home, then rinse your hair and body thoroughly to remove the shampoo and soap.  4. Use CHG as you would any other liquid soap. You can apply CHG directly to the skin and wash gently with a scrungie or a clean washcloth.   5. Apply the CHG Soap to your body ONLY FROM THE NECK DOWN.  Do not use on open wounds or open sores. Avoid contact with your eyes, ears, mouth and genitals (private parts).   6. Wash thoroughly, paying special attention to the area where your surgery will be performed.  7. Thoroughly rinse your body with warm water from the neck down.  8. DO NOT shower/wash with your normal soap after using and rinsing off the CHG Soap.  9. Pat yourself dry with a CLEAN TOWEL.  10. Wear CLEAN PAJAMAS to bed the night before surgery, wear comfortable clothes the morning of surgery  11. Place CLEAN SHEETS on your bed the night of your first shower and DO NOT SLEEP WITH PETS.  Day of Surgery: Shower as instructed above Do not apply any deodorants/lotions. Please wear clean clothes to the hospital/surgery center.   Remember to brush your teeth WITH YOUR REGULAR TOOTHPASTE The Morning of Surgery  Do not wear jewelry, make-up or nail polish.  Do not wear lotions, powders, or perfumes/colognes, or deodorant  Do not shave 48 hours prior to surgery.  Men may shave face and neck.  Do not bring valuables to the hospital.  [facility] is not responsible for any belongings or valuables.  IF you are a smoker, DO NOT Smoke 24 hours prior to surgery  IF you wear a CPAP at night please bring your mask, tubing, and machine the morning of surgery   Remember that you must have someone  to transport you home after your surgery, and remain with you for 24 hours if you are discharged the same day.   Contacts, eyeglasses, hearing aids, dentures or bridgework may not be worn into surgery.    Leave your suitcase in the car.  After surgery it may be brought to your room.  For patients admitted to the hospital, discharge time will be determined by your treatment team.  Patients discharged the day of surgery will not be allowed to drive home. Please read over the following fact sheets that you were given.

## 2019-05-12 ENCOUNTER — Other Ambulatory Visit: Payer: Self-pay

## 2019-05-12 ENCOUNTER — Encounter (HOSPITAL_COMMUNITY)
Admission: RE | Admit: 2019-05-12 | Discharge: 2019-05-12 | Disposition: A | Discharge status: Home / Self Care | Payer: BC Managed Care – PPO | Source: Ambulatory Visit | Attending: General Surgery | Admitting: General Surgery

## 2019-05-12 ENCOUNTER — Encounter (HOSPITAL_COMMUNITY): Payer: Self-pay

## 2019-05-12 DIAGNOSIS — Z01812 Encounter for preprocedural laboratory examination: Secondary | ICD-10-CM | POA: Insufficient documentation

## 2019-05-12 HISTORY — DX: Malignant (primary) neoplasm, unspecified: C80.1

## 2019-05-12 HISTORY — DX: Prediabetes: R73.03

## 2019-05-12 LAB — CBC
HCT: 40.5 % (ref 36.0–46.0)
Hemoglobin: 13 g/dL (ref 12.0–15.0)
MCH: 29.5 pg (ref 26.0–34.0)
MCHC: 32.1 g/dL (ref 30.0–36.0)
MCV: 91.8 fL (ref 80.0–100.0)
Platelets: 269 10*3/uL (ref 150–400)
RBC: 4.41 MIL/uL (ref 3.87–5.11)
RDW: 12.4 % (ref 11.5–15.5)
WBC: 7.3 10*3/uL (ref 4.0–10.5)
nRBC: 0 % (ref 0.0–0.2)

## 2019-05-12 LAB — BASIC METABOLIC PANEL
Anion gap: 10 (ref 5–15)
BUN: 9 mg/dL (ref 6–20)
CO2: 24 mmol/L (ref 22–32)
Calcium: 8.8 mg/dL — ABNORMAL LOW (ref 8.9–10.3)
Chloride: 106 mmol/L (ref 98–111)
Creatinine, Ser: 0.76 mg/dL (ref 0.44–1.00)
GFR calc Af Amer: >60 mL/min (ref >60)
GFR calc non Af Amer: >60 mL/min (ref >60)
Glucose, Bld: 135 mg/dL — ABNORMAL HIGH (ref 70–99)
Potassium: 4.2 mmol/L (ref 3.5–5.1)
Sodium: 140 mmol/L (ref 135–145)

## 2019-05-12 LAB — GLUCOSE, CAPILLARY: Glucose-Capillary: 183 mg/dL — ABNORMAL HIGH (ref 70–99)

## 2019-05-12 NOTE — Progress Notes (Addendum)
PCP - Dr Kathyrn Lass  Cardiologist - no  Chest x-ray - na  EKG - na  Stress Test - 01/11/2017- chest pressure- no need to follow up; has not had chest pressure since then  ECHO - 01/09/2017  Cardiac Cath -  no  Sleep Study - no  CPAP - na  LABS- CBC, BMP Request A1C from PCP- "1 month ago"  ASA-no  ERAS-yes- 1 G2  HA1C-pre diabetes- A1 C 05/12/2019 Fasting Blood Sugar - doesn't check unless she is symptomatic Checks Blood Sugar ___prn__ times a day  Anesthesia-  Pt denies having chest pain, sob, or fever at this time. All instructions explained to the pt, with a verbal understanding of the material. Pt agrees to go over the instructions while at home for a better understanding. Pt also instructed to self quarantine after being tested for COVID-19. The opportunity to ask questions was provided.  I spoke with Dr Donne Hazel , he does want this patient to be on ERAS diet, with 1 G2, clears until 3 hours prior to OR.

## 2019-05-13 ENCOUNTER — Other Ambulatory Visit (HOSPITAL_COMMUNITY)
Admission: RE | Admit: 2019-05-13 | Discharge: 2019-05-13 | Disposition: A | Discharge status: Home / Self Care | Payer: BC Managed Care – PPO | Source: Ambulatory Visit | Attending: General Surgery | Admitting: General Surgery

## 2019-05-13 DIAGNOSIS — Z20828 Contact with and (suspected) exposure to other viral communicable diseases: Secondary | ICD-10-CM | POA: Insufficient documentation

## 2019-05-13 DIAGNOSIS — Z01812 Encounter for preprocedural laboratory examination: Secondary | ICD-10-CM | POA: Insufficient documentation

## 2019-05-13 LAB — POCT PREGNANCY, URINE: Preg Test, Ur: NEGATIVE

## 2019-05-13 LAB — HEMOGLOBIN A1C
Hgb A1c MFr Bld: 6.8 % — ABNORMAL HIGH (ref 4.8–5.6)
Mean Plasma Glucose: 148 mg/dL

## 2019-05-13 LAB — SARS CORONAVIRUS 2 (TAT 6-24 HRS): SARS Coronavirus 2: NEGATIVE

## 2019-05-13 NOTE — Progress Notes (Signed)
Anesthesia Chart Review:  Case: [id number] Date/Time: 05/16/19 1030   Procedures:      RIGHT NIPPLE SPARING MASTECTOMY WITH RIGHT AXILLARY SENTINEL LYMPH NODE BIOPSY RIGHT SEED GUIDED NODE EXCISION (Right Breast)     RIGHT BREAST RECONSTRUCTION WITH PLACEMENT OF TISSUE EXPANDER AND ALLODERM (Right )   Anesthesia type: General   Pre-op diagnosis: RIGHT BREAST CANCER   Location: [facility] OR ROOM 02 / [facility]   Surgeon: Rolm Bookbinder, MD; Irene Limbo, MD    RSL 05/15/19  DISCUSSION: Patient is a 48 year old female scheduled for the above procedure.   History includes never smoker, right breast cancer (invasive ductal carcinoma 02/27/19 biopsy). Reported "pre-diabetes" history.  She had a normal stress echo in 2018 for evaluation of chest pain. She denied any recurrent chest pain since.   Preoperative labs acceptable for OR, although A1c now 6.8%. Urine pregnancy test was negative on 05/13/19. Discussed with anesthesiologist Oleta Mouse, MD and if remains asymptomatic from a CV standpoint then pre-operative EKG not felt indicated.   05/13/19 COVID-19 test in in process. If negative and otherwise no acute changes then I anticipate that she can proceed as planned.   VS: BP (!) 142/85   Pulse 88   Temp (!) 35.6 C (Oral)   Ht 5\' 6"  (1.676 m)   Wt 77.2 kg   LMP 05/01/2019   SpO2 99%   BMI 27.49 kg/m     PROVIDERS: Kathyrn Lass, MD is PCP. Last televisit 03/31/19. Foye Deer, MD is HEM-ONC.  Eppie Gibson, MD is RAD-ONC - She is not routinely followed by cardiology, but was seen by Larae Grooms, MD in 11/2016 for chest pain evaluation. She had a normal stress echo.    LABS: Labs reviewed: Acceptable for surgery. (all labs ordered are listed, but only abnormal results are displayed)  Labs Reviewed  GLUCOSE, CAPILLARY - Abnormal; Notable for the following components:      Result Value   Glucose-Capillary 183 (*)    All other components within normal limits  BASIC  METABOLIC PANEL - Abnormal; Notable for the following components:   Glucose, Bld 135 (*)    Calcium 8.8 (*)    All other components within normal limits  HEMOGLOBIN A1C - Abnormal; Notable for the following components:   Hgb A1c MFr Bld 6.8 (*)    All other components within normal limits  CBC    EKG: Last EKG 12/05/16 showed NSR, non-specific T wave abnormality. EKG not done at PAT due to reported history of pre-diabetes. A1c did come back at 6.8%. She is also on metformin 500 mg daily. She denied chest pain and had normal stress echo in 2018. Non-smoker. Discussed with anesthesiologist Oleta Mouse, MD, and if patient remains asymptomatic then pre-operative EKG not felt indicated.    CV: She had a normal stress echo on 01/01/17.   Past Medical History:  Diagnosis Date  . Abnormal Pap smear of vagina   . Cancer [facility])    Breast Cancer  . Family history of uterine cancer   . Low back pain   . Low HDL (under 40)   . Pre-diabetes   . Rectal bleeding     Past Surgical History:  Procedure Laterality Date  . CESAREAN SECTION    . HEMORRHOID SURGERY    . TONSILLECTOMY    . TONSILLITIS      MEDICATIONS: . Biotin 10000 MCG TBDP  . Calcium Carb-Cholecalciferol (CALCIUM 600+D3) 600-200 MG-UNIT TABS  . Cyanocobalamin (VITAMIN B-12) 5000  MCG SUBL  . ferrous sulfate 325 (65 FE) MG tablet  . metFORMIN (GLUCOPHAGE) 500 MG tablet  . Multiple Vitamins-Minerals (MULTIVITAMIN WITH MINERALS) tablet  . Omega-3 Fatty Acids (FISH OIL) 1200 MG CAPS  . predniSONE (DELTASONE) 50 MG tablet  . simvastatin (ZOCOR) 20 MG tablet  . tamoxifen (NOLVADEX) 20 MG tablet  . vitamin E 400 UNIT capsule   No current facility-administered medications for this encounter.   She is not currently taking prednisone. Tamoxifen and fish oil are on hold for surgery.   Myra Gianotti, PA-C Surgical Short Stay/Anesthesiology [facility] Phone [phone] [facility] Phone [phone] 05/13/2019 4:26 PM

## 2019-05-13 NOTE — Anesthesia Preprocedure Evaluation (Addendum)
Anesthesia Evaluation  Patient identified by MRN, date of birth, ID band Patient awake    Reviewed: Allergy & Precautions, NPO status , Patient's Chart, lab work & pertinent test results  Airway Mallampati: II  TM Distance: >3 FB Neck ROM: Full    Dental no notable dental hx. (+) Teeth Intact, Dental Advisory Given   Pulmonary neg pulmonary ROS,    Pulmonary exam normal breath sounds clear to auscultation       Cardiovascular negative cardio ROS Normal cardiovascular exam Rhythm:Regular Rate:Normal  Stress Echo 2018 normal  HLD   Neuro/Psych negative neurological ROS  negative psych ROS   GI/Hepatic negative GI ROS, Neg liver ROS,   Endo/Other  diabetes, Oral Hypoglycemic Agents  Renal/GU negative Renal ROS  negative genitourinary   Musculoskeletal negative musculoskeletal ROS (+)   Abdominal   Peds  Hematology negative hematology ROS (+)   Anesthesia Other Findings Right breast cancer  Reproductive/Obstetrics                            Anesthesia Physical Anesthesia Plan  ASA: II  Anesthesia Plan: General and Regional   Post-op Pain Management:  Regional for Post-op pain   Induction: Intravenous  PONV Risk Score and Plan: 3 and Midazolam, Dexamethasone and Ondansetron  Airway Management Planned: Oral ETT  Additional Equipment:   Intra-op Plan:   Post-operative Plan: Extubation in OR  Informed Consent: I have reviewed the patients History and Physical, chart, labs and discussed the procedure including the risks, benefits and alternatives for the proposed anesthesia with the patient or authorized representative who has indicated his/her understanding and acceptance.     Dental advisory given  Plan Discussed with: CRNA  Anesthesia Plan Comments: ( )       Anesthesia Quick Evaluation

## 2019-05-15 ENCOUNTER — Ambulatory Visit
Admission: RE | Admit: 2019-05-15 | Discharge: 2019-05-15 | Disposition: A | Discharge status: Home / Self Care | Payer: BC Managed Care – PPO | Source: Ambulatory Visit | Attending: General Surgery | Admitting: General Surgery

## 2019-05-15 ENCOUNTER — Other Ambulatory Visit: Payer: Self-pay

## 2019-05-15 ENCOUNTER — Other Ambulatory Visit: Payer: Self-pay | Admitting: General Surgery

## 2019-05-15 DIAGNOSIS — C50411 Malignant neoplasm of upper-outer quadrant of right female breast: Secondary | ICD-10-CM

## 2019-05-15 DIAGNOSIS — Z170 Estrogen receptor positive status [ER+]: Secondary | ICD-10-CM

## 2019-05-16 ENCOUNTER — Ambulatory Visit (HOSPITAL_COMMUNITY): Payer: BC Managed Care – PPO | Admitting: Anesthesiology

## 2019-05-16 ENCOUNTER — Observation Stay (HOSPITAL_COMMUNITY)
Admission: RE | Admit: 2019-05-16 | Discharge: 2019-05-17 | Disposition: A | Discharge status: Home / Self Care | Payer: BC Managed Care – PPO | Attending: General Surgery | Admitting: General Surgery

## 2019-05-16 ENCOUNTER — Ambulatory Visit (HOSPITAL_COMMUNITY): Payer: BC Managed Care – PPO | Admitting: Vascular Surgery

## 2019-05-16 ENCOUNTER — Encounter (HOSPITAL_COMMUNITY)
Admission: RE | Disposition: A | Discharge status: Home / Self Care | Payer: Self-pay | Source: Home / Self Care | Attending: General Surgery

## 2019-05-16 ENCOUNTER — Other Ambulatory Visit: Payer: Self-pay | Admitting: Hematology and Oncology

## 2019-05-16 ENCOUNTER — Encounter (HOSPITAL_COMMUNITY): Payer: Self-pay

## 2019-05-16 ENCOUNTER — Encounter (HOSPITAL_COMMUNITY)
Admission: RE | Admit: 2019-05-16 | Discharge: 2019-05-16 | Disposition: A | Discharge status: Home / Self Care | Payer: BC Managed Care – PPO | Source: Ambulatory Visit | Attending: General Surgery | Admitting: General Surgery

## 2019-05-16 ENCOUNTER — Ambulatory Visit
Admission: RE | Admit: 2019-05-16 | Discharge: 2019-05-16 | Disposition: A | Discharge status: Home / Self Care | Payer: BC Managed Care – PPO | Source: Ambulatory Visit | Attending: General Surgery | Admitting: General Surgery

## 2019-05-16 DIAGNOSIS — C50911 Malignant neoplasm of unspecified site of right female breast: Secondary | ICD-10-CM | POA: Diagnosis present

## 2019-05-16 DIAGNOSIS — Z170 Estrogen receptor positive status [ER+]: Secondary | ICD-10-CM | POA: Insufficient documentation

## 2019-05-16 DIAGNOSIS — E119 Type 2 diabetes mellitus without complications: Secondary | ICD-10-CM | POA: Diagnosis not present

## 2019-05-16 DIAGNOSIS — C50411 Malignant neoplasm of upper-outer quadrant of right female breast: Principal | ICD-10-CM | POA: Insufficient documentation

## 2019-05-16 DIAGNOSIS — Z888 Allergy status to other drugs, medicaments and biological substances status: Secondary | ICD-10-CM | POA: Diagnosis not present

## 2019-05-16 DIAGNOSIS — C773 Secondary and unspecified malignant neoplasm of axilla and upper limb lymph nodes: Secondary | ICD-10-CM | POA: Diagnosis not present

## 2019-05-16 DIAGNOSIS — Z79810 Long term (current) use of selective estrogen receptor modulators (SERMs): Secondary | ICD-10-CM | POA: Insufficient documentation

## 2019-05-16 DIAGNOSIS — Z9011 Acquired absence of right breast and nipple: Secondary | ICD-10-CM

## 2019-05-16 DIAGNOSIS — Z79899 Other long term (current) drug therapy: Secondary | ICD-10-CM | POA: Diagnosis not present

## 2019-05-16 DIAGNOSIS — Z8049 Family history of malignant neoplasm of other genital organs: Secondary | ICD-10-CM | POA: Insufficient documentation

## 2019-05-16 DIAGNOSIS — Z7984 Long term (current) use of oral hypoglycemic drugs: Secondary | ICD-10-CM | POA: Diagnosis not present

## 2019-05-16 HISTORY — PX: NIPPLE SPARING MASTECTOMY WITH SENTINEL LYMPH NODE BIOPSY: SHX6826

## 2019-05-16 HISTORY — PX: BREAST RECONSTRUCTION WITH PLACEMENT OF TISSUE EXPANDER AND ALLODERM: SHX6805

## 2019-05-16 LAB — GLUCOSE, CAPILLARY
Glucose-Capillary: 128 mg/dL — ABNORMAL HIGH (ref 70–99)
Glucose-Capillary: 126 mg/dL — ABNORMAL HIGH (ref 70–99)
Glucose-Capillary: 257 mg/dL — ABNORMAL HIGH (ref 70–99)
Glucose-Capillary: 287 mg/dL — ABNORMAL HIGH (ref 70–99)

## 2019-05-16 LAB — POCT PREGNANCY, URINE: Preg Test, Ur: NEGATIVE

## 2019-05-16 SURGERY — NIPPLE SPARING MASTECTOMY WITH SENTINEL LYMPH NODE BIOPSY
Anesthesia: Regional | Site: Breast | Laterality: Right

## 2019-05-16 MED ORDER — DEXAMETHASONE SODIUM PHOSPHATE 10 MG/ML IJ SOLN
INTRAMUSCULAR | Status: AC
  Filled 2019-05-16: qty 1

## 2019-05-16 MED ORDER — DEXAMETHASONE SODIUM PHOSPHATE 10 MG/ML IJ SOLN
INTRAMUSCULAR | Status: DC | PRN
  Administered 2019-05-16: 5 mg
  Administered 2019-05-16: 10 mg via INTRAVENOUS

## 2019-05-16 MED ORDER — ROPIVACAINE HCL 5 MG/ML IJ SOLN
INTRAMUSCULAR | Status: DC | PRN
  Administered 2019-05-16: 30 mL via PERINEURAL

## 2019-05-16 MED ORDER — CEFAZOLIN SODIUM-DEXTROSE 2-4 GM/100ML-% IV SOLN
INTRAVENOUS | Status: AC
  Filled 2019-05-16: qty 100

## 2019-05-16 MED ORDER — SIMVASTATIN 20 MG PO TABS
20.0000 mg | ORAL_TABLET | Freq: Every day | ORAL | Status: DC
  Administered 2019-05-17: 10:00:00 20 mg via ORAL
  Filled 2019-05-16: qty 1

## 2019-05-16 MED ORDER — ROCURONIUM BROMIDE 10 MG/ML (PF) SYRINGE
PREFILLED_SYRINGE | INTRAVENOUS | Status: AC
  Filled 2019-05-16: qty 10

## 2019-05-16 MED ORDER — ROCURONIUM BROMIDE 10 MG/ML (PF) SYRINGE
PREFILLED_SYRINGE | INTRAVENOUS | Status: DC | PRN
  Administered 2019-05-16: 60 mg via INTRAVENOUS

## 2019-05-16 MED ORDER — ACETAMINOPHEN 500 MG PO TABS
1000.0000 mg | ORAL_TABLET | ORAL | Status: AC
  Administered 2019-05-16: 09:00:00 1000 mg via ORAL

## 2019-05-16 MED ORDER — ACETAMINOPHEN 500 MG PO TABS
ORAL_TABLET | ORAL | Status: AC
  Administered 2019-05-16: 09:00:00 1000 mg via ORAL
  Filled 2019-05-16: qty 2

## 2019-05-16 MED ORDER — METHOCARBAMOL 500 MG PO TABS: 500.0000 mg | ORAL_TABLET | Freq: Four times a day (QID) | ORAL | Status: DC | PRN

## 2019-05-16 MED ORDER — FENTANYL CITRATE (PF) 100 MCG/2ML IJ SOLN: 25.0000 ug | INTRAMUSCULAR | Status: DC | PRN

## 2019-05-16 MED ORDER — SUGAMMADEX SODIUM 200 MG/2ML IV SOLN
INTRAVENOUS | Status: DC | PRN
  Administered 2019-05-16: 150 mg via INTRAVENOUS

## 2019-05-16 MED ORDER — KETOROLAC TROMETHAMINE 15 MG/ML IJ SOLN
15.0000 mg | INTRAMUSCULAR | Status: AC
  Administered 2019-05-16: 09:00:00 15 mg via INTRAVENOUS

## 2019-05-16 MED ORDER — PROPOFOL 10 MG/ML IV BOLUS
INTRAVENOUS | Status: DC | PRN
  Administered 2019-05-16: 120 mg via INTRAVENOUS

## 2019-05-16 MED ORDER — ENSURE PRE-SURGERY PO LIQD: 296.0000 mL | Freq: Once | ORAL | Status: DC

## 2019-05-16 MED ORDER — MIDAZOLAM HCL 2 MG/2ML IJ SOLN
INTRAMUSCULAR | Status: AC
  Administered 2019-05-16: 2 mg via INTRAVENOUS
  Filled 2019-05-16: qty 2

## 2019-05-16 MED ORDER — ENOXAPARIN SODIUM 40 MG/0.4ML ~~LOC~~ SOLN: 40.0000 mg | SUBCUTANEOUS | Status: DC

## 2019-05-16 MED ORDER — SULFAMETHOXAZOLE-TRIMETHOPRIM 400-80 MG PO TABS: 1.0000 | ORAL_TABLET | Freq: Two times a day (BID) | ORAL | 0 refills | Status: DC

## 2019-05-16 MED ORDER — MORPHINE SULFATE (PF) 2 MG/ML IV SOLN: 1.0000 mg | INTRAVENOUS | Status: DC | PRN

## 2019-05-16 MED ORDER — INSULIN ASPART 100 UNIT/ML ~~LOC~~ SOLN: 0.0000 [IU] | Freq: Three times a day (TID) | SUBCUTANEOUS | Status: DC

## 2019-05-16 MED ORDER — GABAPENTIN 100 MG PO CAPS
ORAL_CAPSULE | ORAL | Status: AC
  Administered 2019-05-16: 100 mg via ORAL
  Filled 2019-05-16: qty 1

## 2019-05-16 MED ORDER — ONDANSETRON HCL 4 MG/2ML IJ SOLN
INTRAMUSCULAR | Status: DC | PRN
  Administered 2019-05-16: 4 mg via INTRAVENOUS

## 2019-05-16 MED ORDER — SIMETHICONE 80 MG PO CHEW: 40.0000 mg | CHEWABLE_TABLET | Freq: Four times a day (QID) | ORAL | Status: DC | PRN

## 2019-05-16 MED ORDER — ACETAMINOPHEN 500 MG PO TABS
1000.0000 mg | ORAL_TABLET | Freq: Four times a day (QID) | ORAL | Status: DC
  Administered 2019-05-16 – 2019-05-17 (×3): 1000 mg via ORAL
  Filled 2019-05-16 (×3): qty 2

## 2019-05-16 MED ORDER — SODIUM CHLORIDE 0.9 % IV SOLN
Freq: Once | INTRAVENOUS | Status: AC
  Administered 2019-05-16: 1000 mL
  Filled 2019-05-16: qty 1

## 2019-05-16 MED ORDER — 0.9 % SODIUM CHLORIDE (POUR BTL) OPTIME
TOPICAL | Status: DC | PRN
  Administered 2019-05-16: 1000 mL

## 2019-05-16 MED ORDER — ONDANSETRON 4 MG PO TBDP: 4.0000 mg | ORAL_TABLET | Freq: Four times a day (QID) | ORAL | Status: DC | PRN

## 2019-05-16 MED ORDER — MIDAZOLAM HCL 2 MG/2ML IJ SOLN
INTRAMUSCULAR | Status: AC
  Filled 2019-05-16: qty 2

## 2019-05-16 MED ORDER — ADULT MULTIVITAMIN W/MINERALS CH
1.0000 | ORAL_TABLET | Freq: Every day | ORAL | Status: DC
  Administered 2019-05-17: 1 via ORAL
  Filled 2019-05-16: qty 1

## 2019-05-16 MED ORDER — CEFAZOLIN SODIUM-DEXTROSE 2-4 GM/100ML-% IV SOLN
2.0000 g | Freq: Three times a day (TID) | INTRAVENOUS | Status: AC
  Administered 2019-05-16 (×2): 2 g via INTRAVENOUS
  Filled 2019-05-16 (×3): qty 100

## 2019-05-16 MED ORDER — LACTATED RINGERS IV SOLN
INTRAVENOUS | Status: DC | PRN
  Administered 2019-05-16: 09:00:00 via INTRAVENOUS

## 2019-05-16 MED ORDER — OXYCODONE HCL 5 MG PO TABS: 5.0000 mg | ORAL_TABLET | ORAL | 0 refills | Status: DC | PRN

## 2019-05-16 MED ORDER — FENTANYL CITRATE (PF) 100 MCG/2ML IJ SOLN
INTRAMUSCULAR | Status: AC
  Administered 2019-05-16: 10:00:00 50 ug via INTRAVENOUS
  Filled 2019-05-16: qty 2

## 2019-05-16 MED ORDER — METFORMIN HCL 500 MG PO TABS
500.0000 mg | ORAL_TABLET | Freq: Every day | ORAL | Status: DC
  Administered 2019-05-17: 500 mg via ORAL
  Filled 2019-05-16: qty 1

## 2019-05-16 MED ORDER — OXYCODONE HCL 5 MG PO TABS: 5.0000 mg | ORAL_TABLET | ORAL | Status: DC | PRN

## 2019-05-16 MED ORDER — KETOROLAC TROMETHAMINE 15 MG/ML IJ SOLN: 15.0000 mg | Freq: Four times a day (QID) | INTRAMUSCULAR | Status: DC | PRN

## 2019-05-16 MED ORDER — PROPOFOL 10 MG/ML IV BOLUS
INTRAVENOUS | Status: AC
  Filled 2019-05-16: qty 20

## 2019-05-16 MED ORDER — FENTANYL CITRATE (PF) 250 MCG/5ML IJ SOLN
INTRAMUSCULAR | Status: AC
  Filled 2019-05-16: qty 5

## 2019-05-16 MED ORDER — FENTANYL CITRATE (PF) 100 MCG/2ML IJ SOLN
INTRAMUSCULAR | Status: DC | PRN
  Administered 2019-05-16 (×2): 50 ug via INTRAVENOUS

## 2019-05-16 MED ORDER — CEFAZOLIN SODIUM-DEXTROSE 2-4 GM/100ML-% IV SOLN
2.0000 g | INTRAVENOUS | Status: AC
  Administered 2019-05-16: 2 g via INTRAVENOUS

## 2019-05-16 MED ORDER — LIDOCAINE 2% (20 MG/ML) 5 ML SYRINGE
INTRAMUSCULAR | Status: AC
  Filled 2019-05-16: qty 5

## 2019-05-16 MED ORDER — ONDANSETRON HCL 4 MG/2ML IJ SOLN: 4.0000 mg | Freq: Four times a day (QID) | INTRAMUSCULAR | Status: DC | PRN

## 2019-05-16 MED ORDER — GABAPENTIN 100 MG PO CAPS
100.0000 mg | ORAL_CAPSULE | ORAL | Status: AC
  Administered 2019-05-16: 09:00:00 100 mg via ORAL

## 2019-05-16 MED ORDER — LIDOCAINE 2% (20 MG/ML) 5 ML SYRINGE
INTRAMUSCULAR | Status: DC | PRN
  Administered 2019-05-16: 100 mg via INTRAVENOUS

## 2019-05-16 MED ORDER — EPHEDRINE SULFATE-NACL 50-0.9 MG/10ML-% IV SOSY
PREFILLED_SYRINGE | INTRAVENOUS | Status: DC | PRN
  Administered 2019-05-16: 5 mg via INTRAVENOUS

## 2019-05-16 MED ORDER — METHOCARBAMOL 500 MG PO TABS: 500.0000 mg | ORAL_TABLET | Freq: Three times a day (TID) | ORAL | 0 refills | Status: DC | PRN

## 2019-05-16 MED ORDER — METHYLENE BLUE 0.5 % INJ SOLN
INTRAVENOUS | Status: AC
  Filled 2019-05-16: qty 10

## 2019-05-16 MED ORDER — MIDAZOLAM HCL 2 MG/2ML IJ SOLN
2.0000 mg | Freq: Once | INTRAMUSCULAR | Status: AC
  Administered 2019-05-16: 10:00:00 2 mg via INTRAVENOUS

## 2019-05-16 MED ORDER — FENTANYL CITRATE (PF) 100 MCG/2ML IJ SOLN
50.0000 ug | Freq: Once | INTRAMUSCULAR | Status: AC
  Administered 2019-05-16: 10:00:00 50 ug via INTRAVENOUS

## 2019-05-16 MED ORDER — SODIUM CHLORIDE 0.9 % IV SOLN
INTRAVENOUS | Status: DC
  Administered 2019-05-16: 16:00:00 via INTRAVENOUS

## 2019-05-16 MED ORDER — SODIUM CHLORIDE 0.9 % IV SOLN
INTRAVENOUS | Status: DC | PRN
  Administered 2019-05-16: 20 ug/min via INTRAVENOUS

## 2019-05-16 MED ORDER — KETOROLAC TROMETHAMINE 15 MG/ML IJ SOLN
INTRAMUSCULAR | Status: AC
  Administered 2019-05-16: 15 mg via INTRAVENOUS
  Filled 2019-05-16: qty 1

## 2019-05-16 MED ORDER — ONDANSETRON HCL 4 MG/2ML IJ SOLN
INTRAMUSCULAR | Status: AC
  Filled 2019-05-16: qty 2

## 2019-05-16 MED ORDER — TECHNETIUM TC 99M SULFUR COLLOID FILTERED
1.0000 | Freq: Once | INTRAVENOUS | Status: AC | PRN
  Administered 2019-05-16: 1 via INTRADERMAL

## 2019-05-16 SURGICAL SUPPLY — 70 items
ADH SKN CLS APL DERMABOND .7 (GAUZE/BANDAGES/DRESSINGS) ×2
ALLODERM 16X20 PERFORATED (Tissue) ×1 IMPLANT
ALLOGRAFT PERF 16X20 1.6+/-0.4 (Tissue) ×1 IMPLANT
APL PRP STRL LF DISP 70% ISPRP (MISCELLANEOUS) ×2
APPLIER CLIP 9.375 MED OPEN (MISCELLANEOUS) ×4
APR CLP MED 9.3 20 MLT OPN (MISCELLANEOUS) ×2
BAG DECANTER FOR FLEXI CONT (MISCELLANEOUS) ×4 IMPLANT
CANISTER SUCT 3000ML PPV (MISCELLANEOUS) ×4 IMPLANT
CHLORAPREP W/TINT 26 (MISCELLANEOUS) ×4 IMPLANT
CLIP APPLIE 9.375 MED OPEN (MISCELLANEOUS) IMPLANT
CONT SPEC 4OZ CLIKSEAL STRL BL (MISCELLANEOUS) ×2 IMPLANT
COVER PROBE W GEL 5X96 (DRAPES) ×4 IMPLANT
COVER SURGICAL LIGHT HANDLE (MISCELLANEOUS) ×4 IMPLANT
DERMABOND ADVANCED (GAUZE/BANDAGES/DRESSINGS) ×2
DERMABOND ADVANCED .7 DNX12 (GAUZE/BANDAGES/DRESSINGS) ×4 IMPLANT
DRAIN CHANNEL 15F RND FF W/TCR (WOUND CARE) ×2 IMPLANT
DRAIN CHANNEL 19F RND (DRAIN) ×4 IMPLANT
DRAPE HALF SHEET 40X57 (DRAPES) ×4 IMPLANT
DRAPE ORTHO SPLIT 77X108 STRL (DRAPES) ×8
DRAPE SURG ORHT 6 SPLT 77X108 (DRAPES) ×4 IMPLANT
DRAPE WARM FLUID 44X44 (DRAPES) ×4 IMPLANT
DRSG PAD ABDOMINAL 8X10 ST (GAUZE/BANDAGES/DRESSINGS) ×2 IMPLANT
DRSG TEGADERM 4X4.75 (GAUZE/BANDAGES/DRESSINGS) ×10 IMPLANT
ELECT BLADE 4.0 EZ CLEAN MEGAD (MISCELLANEOUS) ×4
ELECT CAUTERY BLADE 6.4 (BLADE) ×4 IMPLANT
ELECT COATED BLADE 2.86 ST (ELECTRODE) ×4 IMPLANT
ELECT REM PT RETURN 9FT ADLT (ELECTROSURGICAL) ×4
ELECTRODE BLDE 4.0 EZ CLN MEGD (MISCELLANEOUS) ×2 IMPLANT
ELECTRODE REM PT RTRN 9FT ADLT (ELECTROSURGICAL) ×2 IMPLANT
EVACUATOR SILICONE 100CC (DRAIN) ×4 IMPLANT
EXPANDER TISSUE FV FOURTE 400 (Prosthesis & Implant Plastic) IMPLANT
GLOVE BIO SURGEON STRL SZ 6 (GLOVE) ×12 IMPLANT
GLOVE BIO SURGEON STRL SZ7 (GLOVE) ×6 IMPLANT
GLOVE BIOGEL PI IND STRL 7.5 (GLOVE) ×2 IMPLANT
GLOVE BIOGEL PI INDICATOR 7.5 (GLOVE) ×2
GOWN STRL REUS W/ TWL LRG LVL3 (GOWN DISPOSABLE) ×6 IMPLANT
GOWN STRL REUS W/TWL LRG LVL3 (GOWN DISPOSABLE) ×24
KIT BASIN OR (CUSTOM PROCEDURE TRAY) ×6 IMPLANT
KIT TURNOVER KIT B (KITS) ×4 IMPLANT
LIGHT WAVEGUIDE WIDE FLAT (MISCELLANEOUS) ×4 IMPLANT
MARKER SKIN DUAL TIP RULER LAB (MISCELLANEOUS) ×4 IMPLANT
NS IRRIG 1000ML POUR BTL (IV SOLUTION) ×6 IMPLANT
PACK GENERAL/GYN (CUSTOM PROCEDURE TRAY) ×4 IMPLANT
PAD ARMBOARD 7.5X6 YLW CONV (MISCELLANEOUS) ×4 IMPLANT
PENCIL SMOKE EVACUATOR (MISCELLANEOUS) ×4 IMPLANT
PIN SAFETY STERILE (MISCELLANEOUS) ×2 IMPLANT
PUNCH BIOPSY 4MM (MISCELLANEOUS)
PUNCH BIOPSY DERMAL 6MM STRL (MISCELLANEOUS) IMPLANT
PUNCH BIOPSY DISP 4 (MISCELLANEOUS) IMPLANT
SET ASEPTIC TRANSFER (MISCELLANEOUS) ×4 IMPLANT
SLEEVE SUCTION 125 (MISCELLANEOUS) ×2 IMPLANT
SOL PREP POV-IOD 4OZ 10% (MISCELLANEOUS) ×4 IMPLANT
STAPLER VISISTAT 35W (STAPLE) ×6 IMPLANT
SUT CHROMIC 4 0 PS 2 18 (SUTURE) ×6 IMPLANT
SUT ETHILON 2 0 FS 18 (SUTURE) ×4 IMPLANT
SUT MNCRL AB 4-0 PS2 18 (SUTURE) ×6 IMPLANT
SUT SILK 2 0 SH (SUTURE) ×2 IMPLANT
SUT VIC AB 0 CT2 27 (SUTURE) ×4 IMPLANT
SUT VIC AB 2-0 SH 18 (SUTURE) ×2 IMPLANT
SUT VIC AB 3-0 54X BRD REEL (SUTURE) ×2 IMPLANT
SUT VIC AB 3-0 BRD 54 (SUTURE)
SUT VIC AB 3-0 SH 18 (SUTURE) ×4 IMPLANT
SUT VIC AB 3-0 SH 27 (SUTURE) ×4
SUT VIC AB 3-0 SH 27X BRD (SUTURE) IMPLANT
SUT VICRYL 4-0 PS2 18IN ABS (SUTURE) ×2 IMPLANT
SUT VLOC 180 0 24IN GS25 (SUTURE) ×2 IMPLANT
SYR BULB IRRIGATION 50ML (SYRINGE) ×4 IMPLANT
TISSUE EXPNDR FV FOURTE 400 (Prosthesis & Implant Plastic) ×4 IMPLANT
TOWEL GREEN STERILE (TOWEL DISPOSABLE) ×4 IMPLANT
TOWEL GREEN STERILE FF (TOWEL DISPOSABLE) ×4 IMPLANT

## 2019-05-16 NOTE — H&P (Signed)
Subjective:     Patient ID: Krystal Davis is a 48 y.o. female.  HPI  Here for follow discussion breast reconstruction prior to planned mastectomy. Presented with palpable right breast mass. Diagnostic MMG/US demonstrated a 3.4 cm mass in the right UOQ  and one right axillary LN enlarged. Biopsy showed IDC, ER/PR+, Her2-. The LN biopsy benign, no lymphoid tissue present, and felt to be discordant. Given size of mass compared with breast size, mastectomy has been recommended.  MRI showed biopsy-proven malignancy in right UOQ 4.8 cm. Indeterminate 4 mm mass anterior to the dominant RIGHT breast mass at the 9 o'clock location at middle depth and 6 mm mass involving the LEFT UOQ breast at 2-3 o'clock location noted. Second look [place] with no correlate for indeterminate masses. 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  Genetics with VUS in POLE, SDHA, and SMARCA4 genes.   Plan Oncotype/Mammaprint on surgical specimen.  Current  36C/38 B, happy with this. Wt stable.  Works as Product/process development scientist. Accompanied by husband who is professor of Engineer, production.  Review of Systems Remainder 12 point review negative    Objective:   Physical Exam  Cardiovascular: Normal rate, regular rhythm and normal heart sounds.  Pulmonary/Chest: Effort normal and breath sounds normal.  Abdominal:  Striae present sufficient tissue for reconstruction  Lymphadenopathy:    She has no axillary adenopathy.  Skin:  Fitzpatrick 5   Palpable mass right 9 o clock Grade 1 ptosis bilateral SN to nipple R 23 L 24 cm BW R 16 L 16 cm (CW 13 cm) Nipple to IMF R 9 L 9 cm Midline to nipple R 8 L 7 cm    Assessment:     Right breast cancer UOQ ER+    Plan:      Plan right NSM with immediate expander acellular dermis reconstruction.  Aesthetically she has some ptosis, NAC over lower pole of breast and this may continue in setting NSM. Patient states her NAC position is normal does not  have expectations of being lifted.   Discussed use of acellular dermis in reconstruction, cadaveric source, incorporation over several weeks, risk that if has seroma or infection can act as additional nidus for infection if not incorporated.  Discussed prepectoral vs sub pectoral reconstruction. Discussed with patient benefit of prepectoral placement is no animation deformity, may be less pain. Risk may be more visible rippling over upper poles, greater need of ADM. Reviewed pre pectoral would require larger amount acellular dermis, more drains. Discussed any type reconstruction also risks long term displacement implant and visible rippling. Plan prepectoral placement.  Radiation is a concern in her case but will not know recommendation until final pathology/surgery complete. Reviewedthe increased risk of complications associated with radiation and reconstruction. Autologous tissue as part of reconstruction would reduce this risk.Reviewedoption to delay reconstruction- discussed 6 months post XRT as starting process of reconstruction. In this delayed setting could elect for DIEP vs LD + TE. Patient reports ultimate goal to obtain DIEP. She is aware she will need to establish with new plastic surgeon for this, options available for immediate DIEP but would have to transfer all surgical care to another location. Reviewed benefit of expander at time of mastectomy with this ultimate goal in mind, is to preserve soft tissue envelope, allow for NSM. Expander at time of mastectomy is not absolutely necessary for eventual DIEP. Should anticipate this would be in place for several months during adjuvant treatment and appr 6 months post XRT if  indicated, unable to have MRI with expander in place.  Reviewed reconstruction will be asensate and not stimulate. Reviewed additional risks including but not limited to risks mastectomy flap necrosis requiring additional surgery, seroma, hematoma, asymmetry, need to  additional procedures, fat necrosis, DVT/PE, damage to adjacent structures, cardiopulmonary complications.  Discussed risk COVID infectionthrough this elective surgery. Patient will receive COVID testing prior to surgery. Discussed even if patient receivesa negative test result, the tests in some cases may fail to detect the virus or patient maycontract COVID after the test.COVID 19 infectionbefore/during/aftersurgery may result in lead to a higher chance of complication and death.  Irene Limbo, MD [facility]astic [facility] [phone], pin [phone]

## 2019-05-16 NOTE — Progress Notes (Signed)
Pt admitted to 6N26 from PACU R mastectomy and tissue expanders.  R arm restricted arm band placed and explained to pt. R arm placed on pillow for comfort.  Explained to pt arm precautions for now and future.  JPs x2 on right side, charged.  No c/o pain or nausea at present.  Pt ate all of dinner (vegetarian).  Pt has voided twice in the bathroom.  Husband at bedside, began education on JP drains and will instruct in the morning.  Incentive spirometry instruction done.

## 2019-05-16 NOTE — Op Note (Signed)
Preoperative diagnosis: Clinical stage II right breast cancer Postoperative diagnosis: Same as above Procedure: 1.  Right nipple sparing mastectomy 2.  Right axillary sentinel lymph node biopsy, deep 3.  Right radioactive seed guided axillary node excision Surgeon: Dr. Serita Grammes Assistant: Dr. Irene Limbo Estimated blood loss: 30 cc Specimens: 1.  Right breast tissue marked short stitch short superior, long lateral, double nipple areolar complex 2.  Right nipple specimen 3.  Right radioactive seed containing lymph node, this was also a sentinel lymph node.  The mammogram confirmed removal of the radioactive seed and the clip. 4.  Right axillary sentinel lymph nodes Complications: None Drains: Per plastic surgery Disposition Case turned over to plastic surgery for reconstruction  Indications: This is a 48 year old female who was seen in our multidisciplinary clinic.  She noted a right breast mass.  She had a 3.4 cm mass by ultrasound she had a single axillary node on ultrasound with some focal cortical cortical thickening.  The biopsy of the node was soft tissue and was discordant.  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  She has negative genetic testing.  She had further evaluation eventually we elected to proceed with a nipple sparing mastectomy.  I thought this was reasonable even though the mass was large as it was not adherent to her skin and there was no nipple areolar involvement at all.  I thought I could get a clear margin on this.  I also discussed a seed guided excision of the discordant node to ensure we remove that as well as a sentinel lymph node biopsy.  Procedure: After informed consent was obtained the patient first had a seed placed in the axillary node.  She was given a pectoral block.  Sequential compression devices were in place.  She was given antibiotics.  She was placed under general anesthesia without  complication.  She was prepped and draped in the standard sterile surgical fashion.  A surgical timeout was then performed.  I first did the sentinel node biopsy.  I made an incision below the axillary hairline and carried this to the fascia.  I used the neoprobe to identify the radioactive seed containing node.  I then excised this.  Mammogram confirmed removal of the seed and the clip.  This was also a sentinel lymph node.  There are couple of other sentinel lymph nodes that I removed also.  There was no other background radioactivity and no palpable nodes.  Eventually I closed this with 3-0 Vicryl suture, 4-0 Monocryl, and glue.  I then made an inframammary incision starting about 8 cm from the xiphoid.  I carried this 10 cm laterally in the inframammary fold.  I then created the posterior flap including the pectoralis fascia all the way up to the clavicle, parasternal area, and the latissimus.  I then created the anterior flap using a combination of cautery and scissors.  This was then removed as an entire specimen.  There was no breast tissue remaining.  I then remove the remaining nipple and areolar tissue and sent this as the nipple specimen.  There is no more breast tissue in the nipple at all.  I then obtained hemostasis and turned the case over to plastic surgery for reconstruction.

## 2019-05-16 NOTE — Anesthesia Procedure Notes (Signed)
Procedure Name: Intubation Date/Time: 05/16/2019 10:54 AM Performed by: Kyung Rudd, CRNA Pre-anesthesia Checklist: Patient identified, Emergency Drugs available, Suction available and Patient being monitored Patient Re-evaluated:Patient Re-evaluated prior to induction Oxygen Delivery Method: Circle system utilized Preoxygenation: Pre-oxygenation with 100% oxygen Induction Type: IV induction Ventilation: Mask ventilation without difficulty Laryngoscope Size: Mac and 3 Grade View: Grade II Tube type: Oral Tube size: 7.0 mm Number of attempts: 1 Airway Equipment and Method: Stylet Placement Confirmation: ETT inserted through vocal cords under direct vision,  positive ETCO2 and breath sounds checked- equal and bilateral Secured at: 20 cm Tube secured with: Tape Dental Injury: Teeth and Oropharynx as per pre-operative assessment

## 2019-05-16 NOTE — Anesthesia Procedure Notes (Signed)
Anesthesia Regional Block: Pectoralis block   Pre-Anesthetic Checklist: ,, timeout performed, Correct Patient, Correct Site, Correct Laterality, Correct Procedure, Correct Position, site marked, Risks and benefits discussed,  Surgical consent,  Pre-op evaluation,  At surgeon's request and post-op pain management  Laterality: Right  Prep: Maximum Sterile Barrier Precautions used, chloraprep       Needles:  Injection technique: Single-shot  Needle Type: Echogenic Stimulator Needle     Needle Length: 9cm  Needle Gauge: 22     Additional Needles:   Procedures:,,,, ultrasound used (permanent image in chart),,,,  Narrative:  Start time: 05/16/2019 10:00 AM End time: 05/16/2019 10:10 AM Injection made incrementally with aspirations every 5 mL.  Performed by: Personally  Anesthesiologist: Freddrick March, MD  Additional Notes: Monitors applied. No increased pain on injection. No increased resistance to injection. Injection made in 5cc increments. Good needle visualization. Patient tolerated procedure well.

## 2019-05-16 NOTE — Anesthesia Postprocedure Evaluation (Signed)
Anesthesia Post Note  Patient: Krystal Davis  Procedure(s) Performed: RIGHT NIPPLE SPARING MASTECTOMY WITH RIGHT AXILLARY SENTINEL LYMPH NODE BIOPSY RIGHT SEED GUIDED NODE EXCISION (Right Breast) RIGHT BREAST RECONSTRUCTION WITH PLACEMENT OF TISSUE EXPANDER AND ALLODERM (Right )     Patient location during evaluation: PACU Anesthesia Type: Regional and General Level of consciousness: awake and alert Pain management: pain level controlled Vital Signs Assessment: post-procedure vital signs reviewed and stable Respiratory status: spontaneous breathing, nonlabored ventilation, respiratory function stable and patient connected to nasal cannula oxygen Cardiovascular status: blood pressure returned to baseline and stable Postop Assessment: no apparent nausea or vomiting Anesthetic complications: no    Last Vitals:  Vitals:   05/16/19 1515 05/16/19 1548  BP: 114/72 115/79  Pulse: 91 89  Resp: 10 20  Temp: 37 C 36.8 C  SpO2: 98% 100%    Last Pain:  Vitals:   05/16/19 1548  TempSrc: Oral  PainSc: 2                  Chelsey L Woodrum

## 2019-05-16 NOTE — Op Note (Signed)
Operative Note   DATE OF OPERATION: 9.18.20  LOCATION: [facility]  SURGICAL DIVISION: Plastic Surgery  PREOPERATIVE DIAGNOSES:  1. Right breast cancer UOQ ER+  POSTOPERATIVE DIAGNOSES:  same  PROCEDURE:  1. Right breast reconstruction with tissue expander 2. Acellular dermis (Alloderm) for breast reconstruction 300 cm2  SURGEON: Irene Limbo MD MBA  ASSISTANT: Gentry Fitz RNFA  ANESTHESIA:  General.   EBL: 75 ml for entire procedure  COMPLICATIONS: None immediate.   INDICATIONS FOR PROCEDURE:  The patient, Krystal Davis, is a 48 y.o. female born on Sep 01, 1970, is here for immediate prepectoral expander based reconstruction following nipple sparing mastectomy.   FINDINGS: Natrelle 133S- FV-12-T 400 ml tissue expander, initial fill volume 290 ml air. SN [id number]  DESCRIPTION OF PROCEDURE:  The patient's operative site was marked with the patient in the preoperative area. The patient was taken to the operating room. SCDs were placed and IV antibiotics were given. The patient's operative site was prepped and draped in a sterile fashion. A time out was performed and all information was confirmed to be correct. Following completion mastectomy, cavity irrigated with saline solution containing Ancef, gentamicin, and bacitracin. Hemostasis ensured. A 19 Fr drain was placed in subcutaneous position laterally and a 15 Fr drain placed along inframammary fold. Each secured to skin with 2-0 nylon. Cavity irrigated with Betadine saline solution. The tissue expander was prepared on back table prior in insertion. The expander was filled with air.Perforated acellular dermiswasdraped over anterior surface expander. The ADM was then secured to itself over posterior surface of expanderwith 4-0 chromic. Redundant folds acellular dermis excised so that the ADM layflat without folds over air filled expander.The expander was secured tofascia over lateral sternal borderwith a 0 vicryl.  Thelateral tab wasalso secured to pectoralis muscle with 0-vicryl. The ADM was secured to pectoralis muscle and chest wall along inferior border at inframammary foldwith 0 V-lock suture.Laterally the mastectomy flap over posterior axillary line was advanced anteriorly and the subcutaneous tissue and superficial fascia was secured to pectoralis and serratus muscles with 0-vicryl. Skin closure completedwith 3-0 vicryl in fascial layer and 4-0 vicryl in dermis. Skin closure completed with 4-0 monocryl subcuticular and tissue adhesive.  Patient brought to sitting position.The mastectomy flap was redraped. Patient returned to supine position.Tegaderm dressings applied followed bydry dressing,breast binder.  The patient was allowed to wake from anesthesia, extubated and taken to the recovery room in satisfactory condition.   SPECIMENS: none  DRAINS: 15 and 19 Fr JP drain in subcutaneous right chest  Irene Limbo, MD [facility] [phone], pin [phone]

## 2019-05-16 NOTE — H&P (Signed)
Krystal Davis is an 48 y.o. female.   Chief Complaint: right breast cancer HPI: 78 yof referred by Dr Kathyrn Lass for new right breast mass. she has no prior breast history. she works for [employer]. she lives at home with her husband who is on video during this visit today. she noted a right breast mass not long ago. she has no dc. she was seen and underwent mm that shows dense breasts and a 3.4 cm mass by [place]. she also had single ax node on [place] with 3.6 mm focal cortical thickening. biopsy of the node is soft tissue and this is discordant. biopsy of breast mass is grade I IDC that is er/pr pos, her 2 negative and Ki is 40%. I saw her in [facility] initially. she has undergone genetic testing that is negative (3 vus). she also has mri that shows a 4x2.8x4.8 cm mass in ruoq with a 4 mm mass in right breast also. there is a 6 mm left breast mass. nodes all appear normal on mri. we did not do biopsy of right side as she is having mastectomy. left side was biopsied by mri and is FA concordant. she has also seen plastic surgery since then . she returns today with her husband for surgical planning.   Past Medical History:  Diagnosis Date  . Abnormal Pap smear of vagina   . Cancer [facility])    Breast Cancer  . Family history of uterine cancer   . Low back pain   . Low HDL (under 40)   . Pre-diabetes   . Rectal bleeding     Past Surgical History:  Procedure Laterality Date  . CESAREAN SECTION    . HEMORRHOID SURGERY    . TONSILLECTOMY    . TONSILLITIS      Family History  Problem Relation Age of Onset  . Cancer Mother 7       Ovarian   . Cancer - Other Mother        Peritoneal dx 23s  . Hypertension Father   . CVA Father   . Heart attack Father    Social History:  reports that she has never smoked. She has never used smokeless tobacco. She reports that she does not drink alcohol or use drugs.  Allergies:  Allergies  Allergen Reactions  . Gadolinium Derivatives Nausea And Vomiting     Pt  began vomiting immed post gad inj       Medications Prior to Admission  Medication Sig Dispense Refill  . Biotin 10000 MCG TBDP Take 10,000 mcg by mouth daily after breakfast.    . Calcium Carb-Cholecalciferol (CALCIUM 600+D3) 600-200 MG-UNIT TABS Take 1 tablet by mouth daily after breakfast.    . Cyanocobalamin (VITAMIN B-12) 5000 MCG SUBL Take 5,000 mcg by mouth daily after breakfast.    . ferrous sulfate 325 (65 FE) MG tablet Take 325 mg by mouth daily after breakfast.    . metFORMIN (GLUCOPHAGE) 500 MG tablet Take 500 mg by mouth daily after breakfast.     . Multiple Vitamins-Minerals (MULTIVITAMIN WITH MINERALS) tablet Take 1 tablet by mouth daily after breakfast.     . Omega-3 Fatty Acids (FISH OIL) 1200 MG CAPS Take 1,200 mg by mouth daily.    . simvastatin (ZOCOR) 20 MG tablet Take 20 mg by mouth daily after breakfast.   0  . tamoxifen (NOLVADEX) 20 MG tablet TAKE 1 TABLET BY MOUTH EVERY DAY (Patient taking differently: Take 20 mg by mouth daily  after breakfast. ) 30 tablet 0  . vitamin E 400 UNIT capsule Take 400 Units by mouth daily after breakfast.    . predniSONE (DELTASONE) 50 MG tablet Take 1 tablet by mouth 13, 7, and 1 hour prior to MRI (Patient not taking: Reported on 05/06/2019) 3 tablet 0    Results for orders placed or performed during the hospital encounter of 05/16/19 (from the past 48 hour(s))  Pregnancy, urine POC     Status: None   Collection Time: 05/16/19  9:15 AM  Result Value Ref Range   Preg Test, Ur NEGATIVE NEGATIVE    Comment:        THE SENSITIVITY OF THIS METHODOLOGY IS >24 mIU/mL   Glucose, capillary     Status: Abnormal   Collection Time: 05/16/19  9:26 AM  Result Value Ref Range   Glucose-Capillary 128 (H) 70 - 99 mg/dL   [place] Rt Radioactive Seed Loc  Result Date: 05/15/2019 CLINICAL DATA:  Patient presents for seed localization prior to RIGHT mastectomy. Patient had previous ultrasound-guided core biopsy of an enlarged RIGHT axillary lymph  node which was benign but felt to be discordant. Request is made for seed localization for excision of this lymph node. EXAM: ULTRASOUND GUIDED RADIOACTIVE SEED LOCALIZATION OF THE RIGHT AXILLA COMPARISON:  Previous exam(s). FINDINGS: Patient presents for radioactive seed localization prior to removal of RIGHT axillary lymph node. I met with the patient and we discussed the procedure of seed localization including benefits and alternatives. We discussed the high likelihood of a successful procedure. We discussed the risks of the procedure including infection, bleeding, tissue injury and further surgery. We discussed the low dose of radioactivity involved in the procedure. Informed, written consent was given. The usual time-out protocol was performed immediately prior to the procedure. Using ultrasound guidance, sterile technique, 1% lidocaine and an I-125 radioactive seed, the spiral shaped clip in the RIGHT axilla was localized using a LATERAL to the approach. The follow-up mammogram images confirm the seed in the expected location and were marked for Dr. Donne Hazel. Follow-up survey of the patient confirms presence of the radioactive seed. Order number of I-125 seed:  [id number]. Total activity:  2.841 millicuries reference Date: 05/08/2019 The patient tolerated the procedure well and was released from the [place]. She was given instructions regarding seed removal. IMPRESSION: Radioactive seed localization RIGHT axilla . No apparent complications. Electronically Signed   By: Nolon Nations M.D.   On: 05/15/2019 13:58   Mm Clip Placement Right  Result Date: 05/15/2019 CLINICAL DATA:  Patient presents for seed localization prior to RIGHT mastectomy. Patient had previous ultrasound-guided core biopsy of an enlarged RIGHT axillary lymph node which was benign but felt to be discordant. Request is made for seed localization for excision of this lymph node. EXAM: ULTRASOUND GUIDED RADIOACTIVE SEED LOCALIZATION  OF THE RIGHT AXILLA COMPARISON:  Previous exam(s). FINDINGS: Patient presents for radioactive seed localization prior to removal of RIGHT axillary lymph node. I met with the patient and we discussed the procedure of seed localization including benefits and alternatives. We discussed the high likelihood of a successful procedure. We discussed the risks of the procedure including infection, bleeding, tissue injury and further surgery. We discussed the low dose of radioactivity involved in the procedure. Informed, written consent was given. The usual time-out protocol was performed immediately prior to the procedure. Using ultrasound guidance, sterile technique, 1% lidocaine and an I-125 radioactive seed, the spiral shaped clip in the RIGHT axilla was localized using a LATERAL  to the approach. The follow-up mammogram images confirm the seed in the expected location and were marked for Dr. Donne Hazel. Follow-up survey of the patient confirms presence of the radioactive seed. Order number of I-125 seed:  [id number]. Total activity:  0.990 millicuries reference Date: 05/08/2019 The patient tolerated the procedure well and [place]. She was given instructions regarding seed removal. IMPRESSION: Radioactive seed localization RIGHT axilla . No apparent complications. Electronically Signed   By: Nolon Nations M.D.   On: 05/15/2019 13:58    Review of Systems  All other systems reviewed and are negative.   Blood pressure (!) 149/86, pulse 82, temperature 98.3 F (36.8 C), height '5\' 6"'$  (1.676 m), weight 77.2 kg, last menstrual period 05/01/2019, SpO2 99 %. Physical Exam  General Mental Status-Alert. Orientation-Oriented X3. Head and Neck Trachea-midline. Thyroid Gland Characteristics - normal size and consistency. Eye Sclera/Conjunctiva - Bilateral-No scleral icterus. Chest and Lung Exam Chest and lung exam reveals -quiet, even and easy respiratory effort with no use of  accessory muscles. Breast Nipples-No Discharge. Breast - Bilateral-Symmetric, Non Tender. Note: right breast upper outer quadrant mass about 3.5-4 cm, mobile Cardiovascular Cardiovascular examination reveals -normal heart sounds, regular rate and rhythm with no murmurs. Abdomen Note: soft nt/no hepatomegaly Neurologic Neurologic evaluation reveals -alert and oriented x 3 with no impairment of recent or remote memory. Lymphatic Head & Neck General Head & Neck Lymphatics: Bilateral - Description - Normal. Axillary General Axillary Region: Bilateral - Description - Normal. Note: no [place] adenopathy  Assessment/Plan Right NSM with TAD We discussed a sentinel lymph node biopsy as she does not appear to having lymph node involvement right now. she could however due to discordant biopsy so I will do seed guided node excision of the node with fct if this is possible. . We discussed the performance of that with injection of radioactive tracer. We discussed that there is a chance of having a positive node with a sentinel lymph node biopsy. We discussed up to a 5% risk lifetime of chronic shoulder pain as well as lymphedema associated with a sentinel lymph node biopsy. will ensure we remove the node with FCT that is discordant also with seed placement. We discussed the options for treatment of the breast cancer which included lumpectomy versus a mastectomy. I think right now that lumpectomy is not reasonable for this tumor and her breast size. I also dont think will get chemo and likely wouldnt have significant benefit. neo antiestrogens in premenopausal I dont really recommend either. We discussed mastectomy and the postoperative care for that as well.she would like to undergo recon and would like to try nsm which I think can be achieved.  Rolm Bookbinder, MD 05/16/2019, 9:34 AM

## 2019-05-16 NOTE — Transfer of Care (Signed)
Immediate Anesthesia Transfer of Care Note  Patient: Krystal Davis  Procedure(s) Performed: RIGHT NIPPLE SPARING MASTECTOMY WITH RIGHT AXILLARY SENTINEL LYMPH NODE BIOPSY RIGHT SEED GUIDED NODE EXCISION (Right Breast) RIGHT BREAST RECONSTRUCTION WITH PLACEMENT OF TISSUE EXPANDER AND ALLODERM (Right )  Patient Location: PACU  Anesthesia Type:GA combined with regional for post-op pain  Level of Consciousness: drowsy  Airway & Oxygen Therapy: Patient Spontanous Breathing and Patient connected to nasal cannula oxygen  Post-op Assessment: Report given to RN, Post -op Vital signs reviewed and stable and Patient moving all extremities  Post vital signs: Reviewed and stable  Last Vitals:  Vitals Value Taken Time  BP 137/76 05/16/19 1329  Temp    Pulse 96 05/16/19 1330  Resp 18 05/16/19 1330  SpO2 100 % 05/16/19 1330  Vitals shown include unvalidated device data.  Last Pain:  Vitals:   05/16/19 0911  PainSc: 0-No pain      Patients Stated Pain Goal: 3 ([id number])  Complications: No apparent anesthesia complications

## 2019-05-17 DIAGNOSIS — C50411 Malignant neoplasm of upper-outer quadrant of right female breast: Secondary | ICD-10-CM | POA: Diagnosis not present

## 2019-05-17 LAB — GLUCOSE, CAPILLARY: Glucose-Capillary: 167 mg/dL — ABNORMAL HIGH (ref 70–99)

## 2019-05-17 NOTE — Progress Notes (Signed)
Discharged home today. Personal belongings,discharged instructions given to patient,Advised to pick up prescription called in to pharmacy of choice.Verbalized understanding of instructions

## 2019-05-17 NOTE — Discharge Instructions (Signed)
[facility] [place] °[phone] ° °MASTECTOMY: POST OP INSTRUCTIONS °Take 400 mg of ibuprofen every 8 hours or 650 mg tylenol every 6 hours for next 72 hours then as needed. Use ice several times daily also. °Always review your discharge instruction sheet given to you by the facility where your surgery was performed. ° °IF YOU HAVE DISABILITY OR FAMILY LEAVE FORMS, YOU MUST BRING THEM TO THE OFFICE FOR PROCESSING.   °DO NOT GIVE THEM TO YOUR DOCTOR. °A prescription for pain medication may be given to you upon discharge.  Take your pain medication as prescribed, if needed.  If narcotic pain medicine is not needed, then you may take acetaminophen (Tylenol), naprosyn (Alleve) or ibuprofen (Advil) as needed. °1. Take your usually prescribed medications unless otherwise directed. °2. If you need a refill on your pain medication, please contact your pharmacy.  They will contact our office to request authorization.  Prescriptions will not be filled after 5pm or on week-ends. °3. You should follow a light diet the first few days after arrival home, such as soup and crackers, etc.  Resume your normal diet the day after surgery. °4. Most patients will experience some swelling and bruising on the chest and underarm.  Ice packs will help.  Swelling and bruising can take several days to resolve. Wear the binder day and night until you return to the office.  °5. It is common to experience some constipation if taking pain medication after surgery.  Increasing fluid intake and taking a stool softener (such as Colace) will usually help or prevent this problem from occurring.  A mild laxative (Milk of Magnesia or Miralax) should be taken according to package instructions if there are no bowel movements after 48 hours. °6. Unless discharge instructions indicate otherwise, leave your bandage dry and in place until your next appointment in 3-5 days.  You may take a limited sponge bath.  No tube baths or showers until the  drains are removed.  You may have steri-strips (small skin tapes) in place directly over the incision.  These strips should be left on the skin for 7-10 days. If you have glue it will come off in next couple week.  Any sutures will be removed at an office visit °7. DRAINS:  If you have drains in place, it is important to keep a list of the amount of drainage produced each day in your drains.  Before leaving the hospital, you should be instructed on drain care.  Call our office if you have any questions about your drains. I will remove your drains when they put out less than 30 cc or ml for 2 consecutive days. °8. ACTIVITIES:  You may resume regular (light) daily activities beginning the next day--such as daily self-care, walking, climbing stairs--gradually increasing activities as tolerated.  You may have sexual intercourse when it is comfortable.  Refrain from any heavy lifting or straining until approved by your doctor. °a. You may drive when you are no longer taking prescription pain medication, you can comfortably wear a seatbelt, and you can safely maneuver your car and apply brakes. °b. RETURN TO WORK:  __________________________________________________________ °9. You should see your doctor in the office for a follow-up appointment approximately 3-5 days after your surgery.  Your doctor’s nurse will typically make your follow-up appointment when she calls you with your pathology report.  Expect your pathology report 3-4business days after surgery. °10. OTHER INSTRUCTIONS: ______________________________________________________________________________________________ ____________________________________________________________________________________________ °WHEN TO CALL YOUR DR Krystal Davis: °1. Fever over 101.0 °  2. Nausea and/or vomiting °3. Extreme swelling or bruising °4. Continued bleeding from incision. °5. Increased pain, redness, or drainage from the incision. °The clinic staff is available to answer your  questions during regular business hours.  Please don’t hesitate to call and ask to speak to one of the nurses for clinical concerns.  If you have a medical emergency, go to the nearest emergency room or call 911.  A surgeon from [facility] is always on call at the hospital. °[address], [address], [place], [place]  [address] ? [address] [address], [place], [place]   [address] °[phone] ? [phone] ? FAX [phone] °Web site: [email or web address] ° °

## 2019-05-17 NOTE — Discharge Summary (Signed)
Physician Discharge Summary  Patient ID: Krystal Davis MRN: [id number] DOB/AGE: 12-03-1970 48 y.o.  Admit date: 05/16/2019 Discharge date: 05/17/2019  Admission Diagnoses: Right breast cancer  Discharge Diagnoses:  Active Problems:   S/P mastectomy, right   Discharged Condition: good  Hospital Course: 48 yof s/p right nsm with TAD, expander doing well the following day and will be discharged  Consults: None  Significant Diagnostic Studies: none  Treatments: surgery: right nsm with tad, expander    Disposition: Discharge disposition: 01-Home or Self Care       Discharge Instructions    Call MD for:  redness, tenderness, or signs of infection (pain, swelling, bleeding, redness, odor or green/yellow discharge around incision site)   Complete by: As directed    Call MD for:  temperature >100.5   Complete by: As directed    Discharge instructions   Complete by: As directed    Ok to remove dressings and shower am 9.20.20. Soap and water ok, pat Tegaderms dry. Do not remove Tegaderms. No creams or ointments over incisions. Do not let drains dangle in shower, attach to lanyard or similar.Strip and record drains twice daily and bring log to clinic visit.  Breast binder or soft compression bra garment all other times.  Ok to raise arms above shoulders for bathing and dressing.  No house yard work or exercise until cleared by MD.   Recommend ibuprofen with meals as directed to aid with pain control. Also ok to use Tylenol for pain. Recommend MIralax or Dulcolax as needed for constipation.   Driving Restrictions   Complete by: As directed    No driving for 2 weeks then no driving if taking narcotics   Lifting restrictions   Complete by: As directed    No lifting > 5 lbs until cleared by MD   Resume previous diet   Complete by: As directed      Allergies as of 05/17/2019      Reactions   Gadolinium Derivatives Nausea And Vomiting   Pt  began vomiting immed post gad inj          Medication List    STOP taking these medications   predniSONE 50 MG tablet Commonly known as: DELTASONE   tamoxifen 20 MG tablet Commonly known as: NOLVADEX     TAKE these medications   Biotin 10000 MCG Tbdp Take 10,000 mcg by mouth daily after breakfast.   Calcium 600+D3 600-200 MG-UNIT Tabs Generic drug: Calcium Carb-Cholecalciferol Take 1 tablet by mouth daily after breakfast.   ferrous sulfate 325 (65 FE) MG tablet Take 325 mg by mouth daily after breakfast.   Fish Oil 1200 MG Caps Take 1,200 mg by mouth daily.   metFORMIN 500 MG tablet Commonly known as: GLUCOPHAGE Take 500 mg by mouth daily after breakfast.   methocarbamol 500 MG tablet Commonly known as: Robaxin Take 1 tablet (500 mg total) by mouth every 8 (eight) hours as needed for muscle spasms.   multivitamin with minerals tablet Take 1 tablet by mouth daily after breakfast.   oxyCODONE 5 MG immediate release tablet Commonly known as: Roxicodone Take 1 tablet (5 mg total) by mouth every 4 (four) hours as needed for moderate pain or severe pain.   simvastatin 20 MG tablet Commonly known as: ZOCOR Take 20 mg by mouth daily after breakfast.   sulfamethoxazole-trimethoprim 400-80 MG tablet Commonly known as: Bactrim Take 1 tablet by mouth 2 (two) times daily.   Vitamin B-12 5000 MCG Subl Take 5,000  mcg by mouth daily after breakfast.   vitamin E 400 UNIT capsule Take 400 Units by mouth daily after breakfast.      Follow-up Information    Irene Limbo, MD In 1 week.   Specialty: Plastic Surgery Why: as scheduled Contact information: [address] [place] [id number]        Rolm Bookbinder, MD In 2 weeks.   Specialty: General Surgery Contact information: [place] [address] [phone]           Signed: Rolm Bookbinder 05/17/2019, 8:40 AM

## 2019-05-17 NOTE — Progress Notes (Signed)
1 Day Post-Op   Subjective/Chief Complaint: Doing great, ready to go   Objective: Vital signs in last 24 hours: Temp:  [98 F (36.7 C)-98.6 F (37 C)] 98.2 F (36.8 C) (09/19 0447) Pulse Rate:  [72-96] 84 (09/19 0447) Resp:  [9-20] 18 (09/19 0447) BP: (114-153)/(65-88) 119/73 (09/19 0447) SpO2:  [97 %-100 %] 98 % (09/19 0447) Weight:  [77.2 kg] 77.2 kg (09/18 1548)    Intake/Output from previous day: 09/18 0701 - 09/19 0700 In: 1690.2 [P.O.:670; I.V.:825.3; IV Piggyback:94.9] Out: 797 [Urine:600; Drains:122; Blood:75] Intake/Output this shift: No intake/output data recorded.  Right mastectomy incision clean, drains serosang, expander in place   Studies/Results: Nm Sentinel Node Inj-no Rpt (breast)  Result Date: 05/16/2019 Sulfur colloid was injected by the nuclear medicine technologist for melanoma sentinel node.   Mm Breast Surgical Specimen  Result Date: 05/16/2019 CLINICAL DATA:  Evaluate surgical specimen following RIGHT axillary lymph node excision. EXAM: SPECIMEN RADIOGRAPH OF THE RIGHT AXILLA COMPARISON:  Previous exam(s). FINDINGS: Status post excision of the RIGHT axilla. The radioactive seed and biopsy marker clip are present, completely intact, and were marked for pathology. IMPRESSION: Specimen radiograph of the RIGHT axilla Electronically Signed   By: Margarette Canada M.D.   On: 05/16/2019 11:22   [place] Rt Radioactive Seed Loc  Result Date: 05/15/2019 CLINICAL DATA:  Patient presents for seed localization prior to RIGHT mastectomy. Patient had previous ultrasound-guided core biopsy of an enlarged RIGHT axillary lymph node which was benign but felt to be discordant. Request is made for seed localization for excision of this lymph node. EXAM: ULTRASOUND GUIDED RADIOACTIVE SEED LOCALIZATION OF THE RIGHT AXILLA COMPARISON:  Previous exam(s). FINDINGS: Patient presents for radioactive seed localization prior to removal of RIGHT axillary lymph node. I met with the patient and  we discussed the procedure of seed localization including benefits and alternatives. We discussed the high likelihood of a successful procedure. We discussed the risks of the procedure including infection, bleeding, tissue injury and further surgery. We discussed the low dose of radioactivity involved in the procedure. Informed, written consent was given. The usual time-out protocol was performed immediately prior to the procedure. Using ultrasound guidance, sterile technique, 1% lidocaine and an I-125 radioactive seed, the spiral shaped clip in the RIGHT axilla was localized using a LATERAL to the approach. The follow-up mammogram images confirm the seed in the expected location and were marked for Dr. Donne Hazel. Follow-up survey of the patient confirms presence of the radioactive seed. Order number of I-125 seed:  [id number]. Total activity:  9.379 millicuries reference Date: 05/08/2019 The patient tolerated the procedure well and was released from the [facility]. She was given instructions regarding seed removal. IMPRESSION: Radioactive seed localization RIGHT axilla . No apparent complications. Electronically Signed   By: Nolon Nations M.D.   On: 05/15/2019 13:58   Mm Clip Placement Right  Result Date: 05/15/2019 CLINICAL DATA:  Patient presents for seed localization prior to RIGHT mastectomy. Patient had previous ultrasound-guided core biopsy of an enlarged RIGHT axillary lymph node which was benign but felt to be discordant. Request is made for seed localization for excision of this lymph node. EXAM: ULTRASOUND GUIDED RADIOACTIVE SEED LOCALIZATION OF THE RIGHT AXILLA COMPARISON:  Previous exam(s). FINDINGS: Patient presents for radioactive seed localization prior to removal of RIGHT axillary lymph node. I met with the patient and we discussed the procedure of seed localization including benefits and alternatives. We discussed the high likelihood of a successful procedure. We discussed the risks of the  procedure including infection, bleeding, tissue injury and further surgery. We discussed the low dose of radioactivity involved in the procedure. Informed, written consent was given. The usual time-out protocol was performed immediately prior to the procedure. Using ultrasound guidance, sterile technique, 1% lidocaine and an I-125 radioactive seed, the spiral shaped clip in the RIGHT axilla was localized using a LATERAL to the approach. The follow-up mammogram images confirm the seed in the expected location and were marked for Dr. Donne Hazel. Follow-up survey of the patient confirms presence of the radioactive seed. Order number of I-125 seed:  [id number]. Total activity:  2.548 millicuries reference Date: 05/08/2019 The patient tolerated the procedure well and was released from the [facility]. She was given instructions regarding seed removal. IMPRESSION: Radioactive seed localization RIGHT axilla . No apparent complications. Electronically Signed   By: Nolon Nations M.D.   On: 05/15/2019 13:58    Anti-infectives: Anti-infectives (From admission, onward)   Start     Dose/Rate Route Frequency Ordered Stop   05/16/19 1600  ceFAZolin (ANCEF) IVPB 2g/100 mL premix     2 g 200 mL/hr over 30 Minutes Intravenous Every 8 hours 05/16/19 1545 05/16/19 2209   05/16/19 1100  bacitracin 50,000 Units, gentamicin (GARAMYCIN) 80 mg, ceFAZolin (ANCEF) 1 g in sodium chloride 0.9 % 1,000 mL      Irrigation Once 05/16/19 1054 05/16/19 1221   05/16/19 0930  ceFAZolin (ANCEF) IVPB 2g/100 mL premix     2 g 200 mL/hr over 30 Minutes Intravenous To Short Stay 05/16/19 0853 05/16/19 1111   05/16/19 0903  ceFAZolin (ANCEF) 2-4 GM/100ML-% IVPB    Note to Pharmacy: Therese Sarah   : cabinet override      05/16/19 0903 05/16/19 1056   05/16/19 0000  sulfamethoxazole-trimethoprim (BACTRIM) 400-80 MG tablet     1 tablet Oral 2 times daily 05/16/19 1329        Assessment/Plan: POD 1 right nsm with TAD, expander -  dc home today -drain teaching  Rolm Bookbinder 05/17/2019

## 2019-05-19 ENCOUNTER — Encounter (HOSPITAL_COMMUNITY): Payer: Self-pay | Admitting: General Surgery

## 2019-05-20 LAB — SURGICAL PATHOLOGY

## 2019-05-22 ENCOUNTER — Telehealth: Payer: Self-pay | Admitting: *Deleted

## 2019-05-22 NOTE — Telephone Encounter (Signed)
Received order for mammaprint testing. Requisition faxed to pathology and Smiley Houseman

## 2019-05-23 ENCOUNTER — Telehealth: Payer: Self-pay | Admitting: *Deleted

## 2019-05-23 NOTE — Telephone Encounter (Signed)
Spoke to pt regarding mammaprint testing and reasoning. Discussed next steps and post-op appt with Dr. Lindi Adie.

## 2019-05-26 NOTE — Progress Notes (Signed)
Patient Care Team: Kathyrn Lass, MD as PCP - General (Family Medicine) Mauro Kaufmann, RN as Oncology Nurse Navigator Rockwell Germany, RN as Oncology Nurse Navigator Rolm Bookbinder, MD as Consulting Physician (General Surgery) Nicholas Lose, MD as Consulting Physician (Hematology and Oncology) Eppie Gibson, MD as Attending Physician (Radiation Oncology)  DIAGNOSIS:    ICD-10-CM   1. Malignant neoplasm of upper-outer quadrant of right breast in female, estrogen receptor positive (Krystal Davis)  C50.411    Z17.0     SUMMARY OF ONCOLOGIC HISTORY: Oncology History  Malignant neoplasm of upper-outer quadrant of right breast in female, estrogen receptor positive (Krystal Davis)  03/25/2019 Initial Diagnosis   Patient palpated right breast lump. Diagnostic mammogram showed 3.4cm mass in the UOQ and one right axillary lymph node with cortical thickening measuring 3.41m. 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   03/26/2019 Cancer Staging   Staging form: Breast, AJCC 8th Edition - Clinical stage from 03/26/2019: Stage IB (cT2, cN0, cM0, G1, ER+, PR+, HER2-) - Signed by GNicholas Lose MD on 03/26/2019    Genetic Testing   3 Variants of Uncertain Significance (VUS) identified: one in the POLE gene called c.3762C>G, one in the SDHA gene called c.263C>G, and one [facility] called c.2176C>T. The STAT Breast cancer panel offered by [facility] includes sequencing and rearrangement analysis for the following 9 genes:  ATM, BRCA1, BRCA2, CDH1, CHEK2, PALB2, PTEN, STK11 and TP53.  The Common Hereditary Cancers Panel offered by [facility] includes sequencing and/or deletion duplication testing of the following 47 genes: APC, ATM, AXIN2, BARD1, BMPR1A, BRCA1, BRCA2, BRIP1, CDH1, CDKN2A (p14ARF), CDKN2A (p16INK4a), CKD4, CHEK2, CTNNA1, DICER1, EPCAM (Deletion/duplication testing only), GREM1 (promoter region deletion/duplication testing only),  KIT, MEN1, MLH1, MSH2, MSH3, MSH6, MUTYH, NBN, NF1, NHTL1, PALB2, PDGFRA, PMS2, POLD1, POLE, PTEN, RAD50, RAD51C, RAD51D,SDHB, SDHC, SDHD, SMAD4, SMARCA4. STK11, TP53, TSC1, TSC2, and VHL.  The following genes were evaluated for sequence changes only: SDHA and HOXB13 c.251G>A variant only. Report date is 04/02/2019.   05/16/2019 Surgery   Right mastectomy with reconstruction (Dr. WDonne Hazel Dr. TIran Planas: two foci of grade 2 IDC, larger spanning 6.0cm, with intermediate grade DCIS and 3 right axillary lymph nodes positive for metastatic carcinoma.      CHIEF COMPLIANT: Follow-up Krystal/p right mastectomy to review pathology  INTERVAL HISTORY: SMiliyah Davis a 48y.o. with above-mentioned history of right breast cancer. Genetic testing was negative. She underwent a right mastectomy with reconstruction on 05/16/19 with Dr. WDonne Hazeland Dr. TIran Planasfor which pathology showed two foci of grade 2 invasive ductal carcinoma, the larger spanning 6.0cm, with intermediate grade DCIS and 3 right axillary lymph nodes positive for metastatic carcinoma. She presents to the clinic today to discuss the pathology report and further treatment.   REVIEW OF SYSTEMS:   Constitutional: Denies fevers, chills or abnormal weight loss Eyes: Denies blurriness of vision Ears, nose, mouth, throat, and face: Denies mucositis or sore throat Respiratory: Denies cough, dyspnea or wheezes Cardiovascular: Denies palpitation, chest discomfort Gastrointestinal: Denies nausea, heartburn or change in bowel habits Skin: Denies abnormal skin rashes Lymphatics: Denies new lymphadenopathy or easy bruising Neurological: Denies numbness, tingling or new weaknesses Behavioral/Psych: Mood is stable, no new changes  Extremities: No lower extremity edema Breast: Krystal/p right mastectomy  All other systems were reviewed with the patient and are negative.  I have reviewed the past medical history, past surgical history, social  history and  family history with the patient and they are unchanged from previous note.  ALLERGIES:  is allergic to gadolinium derivatives.  MEDICATIONS:  Current Outpatient Medications  Medication Sig Dispense Refill   Biotin 10000 MCG TBDP Take 10,000 mcg by mouth daily after breakfast.     Calcium Carb-Cholecalciferol (CALCIUM 600+D3) 600-200 MG-UNIT TABS Take 1 tablet by mouth daily after breakfast.     Cyanocobalamin (VITAMIN B-12) 5000 MCG SUBL Take 5,000 mcg by mouth daily after breakfast.     ferrous sulfate 325 (65 FE) MG tablet Take 325 mg by mouth daily after breakfast.     metFORMIN (GLUCOPHAGE) 500 MG tablet Take 500 mg by mouth daily after breakfast.      methocarbamol (ROBAXIN) 500 MG tablet Take 1 tablet (500 mg total) by mouth every 8 (eight) hours as needed for muscle spasms. 30 tablet 0   Multiple Vitamins-Minerals (MULTIVITAMIN WITH MINERALS) tablet Take 1 tablet by mouth daily after breakfast.      Omega-3 Fatty Acids (FISH OIL) 1200 MG CAPS Take 1,200 mg by mouth daily.     oxyCODONE (ROXICODONE) 5 MG immediate release tablet Take 1 tablet (5 mg total) by mouth every 4 (four) hours as needed for moderate pain or severe pain. 40 tablet 0   simvastatin (ZOCOR) 20 MG tablet Take 20 mg by mouth daily after breakfast.   0   sulfamethoxazole-trimethoprim (BACTRIM) 400-80 MG tablet Take 1 tablet by mouth 2 (two) times daily. 12 tablet 0   vitamin E 400 UNIT capsule Take 400 Units by mouth daily after breakfast.     No current facility-administered medications for this visit.     PHYSICAL EXAMINATION: ECOG PERFORMANCE STATUS: 1 - Symptomatic but completely ambulatory  Vitals:   05/27/19 1115  BP: 122/76  Pulse: 82  Resp: 18  Temp: 98.9 F (37.2 C)  SpO2: 99%   Filed Weights   05/27/19 1115  Weight: 168 lb 6.4 oz (76.4 kg)    GENERAL: alert, no distress and comfortable SKIN: skin color, texture, turgor are normal, no rashes or significant lesions EYES: normal,  Conjunctiva are pink and non-injected, sclera clear OROPHARYNX: no exudate, no erythema and lips, buccal mucosa, and tongue normal  NECK: supple, thyroid normal size, non-tender, without nodularity LYMPH: no palpable lymphadenopathy in the cervical, axillary or inguinal LUNGS: clear to auscultation and percussion with normal breathing effort HEART: regular rate & rhythm and no murmurs and no lower extremity edema ABDOMEN: abdomen soft, non-tender and normal bowel sounds MUSCULOSKELETAL: no cyanosis of digits and no clubbing  NEURO: alert & oriented x 3 with fluent speech, no focal motor/sensory deficits EXTREMITIES: No lower extremity edema  LABORATORY DATA:  I have reviewed the data as listed CMP Latest Ref Rng & Units 05/12/2019 03/26/2019  Glucose 70 - 99 mg/dL 135(H) 176(H)  BUN 6 - 20 mg/dL 9 12  Creatinine 0.44 - 1.00 mg/dL 0.76 0.84  Sodium 135 - 145 mmol/L 140 139  Potassium 3.5 - 5.1 mmol/L 4.2 4.2  Chloride 98 - 111 mmol/L 106 104  CO2 22 - 32 mmol/L 24 26  Calcium 8.9 - 10.3 mg/dL 8.8(L) 9.4  Total Protein 6.5 - 8.1 g/dL - 6.7  Total Bilirubin 0.3 - 1.2 mg/dL - 0.3  Alkaline Phos 38 - 126 U/L - 60  AST 15 - 41 U/L - 13(L)  ALT 0 - 44 U/L - 15    Lab Results  Component Value Date   WBC 7.3 05/12/2019  HGB 13.0 05/12/2019   HCT 40.5 05/12/2019   MCV 91.8 05/12/2019   PLT 269 05/12/2019   NEUTROABS 4.3 03/26/2019    ASSESSMENT & PLAN:  Malignant neoplasm of upper-outer quadrant of right breast in female, estrogen receptor positive ([facility]) 03/25/2019:Patient palpated right breast lump. Diagnostic mammogram showed 3.4cm mass in the UOQ and one right axillary lymph node with cortical thickening measuring 3.38m. 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 T2 N0 stage Ib clinical stage  Right mastectomy: Grade 2 IDC with DCIS, 2 foci largest span 6 cm, margins negative, 3/3 lymph nodes positive,  ER 100%, PR 60%, HER-2 negative, Ki-67 40%, lymphovascular invasion is present, nipple positive for IDC  Treatment plan: 1.  MammaPrint testing: I discussed with her that MammaPrint test is reliable for anyone with less than 3+ lymph nodes.  If on axillary lymph node dissection additional nodes are identified then she will need chemotherapy irrespective of the MammaPrint test result. Patient understands implications of the test and has agreed to proceed with the plan. 2.  Once we have MammaPrint test result then she will be scheduled to undergo axillary lymph node dissection so that the decision regarding port can be made. 3.  Adjuvant radiation therapy 4.  Followed by adjuvant antiestrogen therapy 5.  CT chest abdomen pelvis and bone scan will be ordered.  Return to clinic after axillary lymph node dissection    No orders of the defined types were placed in this encounter.  The patient has a good understanding of the overall plan. she agrees with it. she will call with any problems that may develop before the next visit here.  GNicholas Lose MD 05/27/2019  IJulious OkaDorshimer am acting as scribe for Dr. VNicholas Lose  I have reviewed the above documentation for accuracy and completeness, and I agree with the above.

## 2019-05-27 ENCOUNTER — Telehealth: Payer: Self-pay | Admitting: *Deleted

## 2019-05-27 ENCOUNTER — Inpatient Hospital Stay: Payer: BC Managed Care – PPO | Attending: Hematology and Oncology | Admitting: Hematology and Oncology

## 2019-05-27 ENCOUNTER — Other Ambulatory Visit: Payer: Self-pay

## 2019-05-27 ENCOUNTER — Telehealth: Payer: Self-pay

## 2019-05-27 DIAGNOSIS — Z7984 Long term (current) use of oral hypoglycemic drugs: Secondary | ICD-10-CM | POA: Diagnosis not present

## 2019-05-27 DIAGNOSIS — C773 Secondary and unspecified malignant neoplasm of axilla and upper limb lymph nodes: Secondary | ICD-10-CM | POA: Diagnosis not present

## 2019-05-27 DIAGNOSIS — Z79899 Other long term (current) drug therapy: Secondary | ICD-10-CM | POA: Insufficient documentation

## 2019-05-27 DIAGNOSIS — Z170 Estrogen receptor positive status [ER+]: Secondary | ICD-10-CM | POA: Insufficient documentation

## 2019-05-27 DIAGNOSIS — Z9011 Acquired absence of right breast and nipple: Secondary | ICD-10-CM | POA: Diagnosis not present

## 2019-05-27 DIAGNOSIS — C50411 Malignant neoplasm of upper-outer quadrant of right female breast: Secondary | ICD-10-CM | POA: Insufficient documentation

## 2019-05-27 MED ORDER — PREDNISONE 50 MG PO TABS: ORAL_TABLET | ORAL | 0 refills | Status: AC

## 2019-05-27 MED ORDER — PREDNISONE 50 MG PO TABS: 50.0000 mg | ORAL_TABLET | ORAL | Status: DC

## 2019-05-27 NOTE — Assessment & Plan Note (Signed)
03/25/2019:Patient palpated right breast lump. Diagnostic mammogram showed 3.4cm mass in the UOQ and one right axillary lymph node with cortical thickening measuring 3.34m. 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 T2 N0 stage Ib clinical stage  Right mastectomy: Grade 2 IDC with DCIS, 2 foci largest span 6 cm, margins negative, 3/3 lymph nodes positive, ER 100%, PR 60%, HER-2 negative, Ki-67 40%, lymphovascular invasion is present, nipple positive for IDC  Treatment plan: 1.  MammaPrint testing: I discussed with her that MammaPrint test is reliable for anyone with less than 3+ lymph nodes.  If on axillary lymph node dissection additional nodes are identified then she will need chemotherapy irrespective of the MammaPrint test result. Patient understands implications of the test and has agreed to proceed with the plan. 2.  Once we have MammaPrint test result then she will be scheduled to undergo axillary lymph node dissection so that the decision regarding port can be made. 3.  Adjuvant radiation therapy 4.  Followed by adjuvant antiestrogen therapy  Return to clinic after axillary lymph node dissection

## 2019-05-27 NOTE — Telephone Encounter (Signed)
RN notified patient of imaging that has been scheduled for 10/5.  RN reviewed instructions, including 13 hour prep.  Pt verbalized understanding.  Rx sent to pharmacy, pt will come to clinic to pick up oral contrast.

## 2019-05-27 NOTE — Telephone Encounter (Signed)
Records faxed to [place] Dr. Nathaneil Canary & Dr. Elisha Headland - Release [id number]

## 2019-05-28 NOTE — Addendum Note (Signed)
Addended by: Rennis Harding on: 05/28/2019 08:15 AM   Modules accepted: Orders

## 2019-05-29 ENCOUNTER — Telehealth: Payer: Self-pay

## 2019-05-29 NOTE — Telephone Encounter (Signed)
RN spoke with patient.  Patient is collaborating with [facility] and wishes to have her scans completed at their clinic.   RN confirmed that pt will be having CT CAP, and Bone Scan with [facility].  Upcoming appointments at [facility] cancelled per patient's request.   Pt voiced appreciation.  RN provided contact information for collaboration with [facility] if needed.

## 2019-06-02 ENCOUNTER — Encounter (HOSPITAL_COMMUNITY): Payer: BC Managed Care – PPO

## 2019-06-02 ENCOUNTER — Ambulatory Visit (HOSPITAL_COMMUNITY): Admission: RE | Admit: 2019-06-02 | Payer: BC Managed Care – PPO | Source: Ambulatory Visit

## 2019-06-03 ENCOUNTER — Telehealth: Payer: Self-pay | Admitting: Hematology and Oncology

## 2019-06-03 ENCOUNTER — Encounter: Payer: Self-pay | Admitting: *Deleted

## 2019-06-03 NOTE — Telephone Encounter (Signed)
I informed the patient that the MammaPrint came back as high risk and that she does need to go through systemic chemotherapy. She informed me that Dr. Alvan Dame is performing surgery and that Dr. Nathaneil Canary will be her medical oncologist who has already determined that she needs chemotherapy. I will email this report to her so that she has a copy. She will be followed at [facility] for all of her medical oncology and surgical needs.

## 2019-06-09 ENCOUNTER — Telehealth: Payer: Self-pay | Admitting: Physical Therapy

## 2019-06-09 ENCOUNTER — Encounter (HOSPITAL_COMMUNITY): Payer: Self-pay | Admitting: Hematology and Oncology

## 2019-06-09 ENCOUNTER — Ambulatory Visit: Payer: BC Managed Care – PPO | Attending: General Surgery | Admitting: Physical Therapy

## 2019-06-09 NOTE — Telephone Encounter (Signed)
Phoned patient as she no showed for her PT appt today. She let me know she's transferred her care to [facility] and just had an ALND 3 days ago. I let her know we are still happy to see her for PT if needed after she recovers from surgery. Gave her our # if she wants to contact us. Krystal Davis, [place] 06/09/19 11:33 AM'

## 2019-07-31 ENCOUNTER — Other Ambulatory Visit: Payer: Self-pay

## 2019-07-31 ENCOUNTER — Ambulatory Visit: Payer: BC Managed Care – PPO | Attending: Hematology & Oncology | Admitting: Physical Therapy

## 2019-07-31 ENCOUNTER — Encounter: Payer: Self-pay | Admitting: Physical Therapy

## 2019-07-31 DIAGNOSIS — Z483 Aftercare following surgery for neoplasm: Secondary | ICD-10-CM | POA: Insufficient documentation

## 2019-07-31 DIAGNOSIS — I972 Postmastectomy lymphedema syndrome: Secondary | ICD-10-CM | POA: Diagnosis present

## 2019-07-31 DIAGNOSIS — C50411 Malignant neoplasm of upper-outer quadrant of right female breast: Secondary | ICD-10-CM | POA: Insufficient documentation

## 2019-07-31 DIAGNOSIS — R293 Abnormal posture: Secondary | ICD-10-CM | POA: Insufficient documentation

## 2019-07-31 DIAGNOSIS — M25611 Stiffness of right shoulder, not elsewhere classified: Secondary | ICD-10-CM

## 2019-07-31 DIAGNOSIS — Z170 Estrogen receptor positive status [ER+]: Secondary | ICD-10-CM | POA: Insufficient documentation

## 2019-07-31 NOTE — Patient Instructions (Signed)
Cane Exercise: Abduction    Lay on your back. Hold cane with right hand over end, palm-up, with other hand palm-down. Move arm out from side and up by pushing with other arm. Hold _5___ seconds. Repeat _10___ times. Do _2___ sessions per day.  http://gt2.exer.us/82   Copyright  VHI. All rights reserved.

## 2019-07-31 NOTE — Therapy (Signed)
[place], [place], [address] Phone: [phone]   Fax:  [phone]  Physical Therapy Treatment  Patient Details  Name: Krystal Davis MRN: [id number] Date of Birth: 05/09/1971 Referring Provider (PT): Dr. Josie Dixon    Encounter Date: 07/31/2019  PT End of Session - 07/31/19 1157    Visit Number  2    Number of Visits  10    Date for PT Re-Evaluation  08/28/19    PT Start Time  1012    PT Stop Time  1106    PT Time Calculation (min)  54 min    Activity Tolerance  Patient tolerated treatment well    Behavior During Therapy  [facility] for tasks assessed/performed       Past Medical History:  Diagnosis Date  . Abnormal Pap smear of vagina   . Cancer [facility])    Breast Cancer  . Family history of uterine cancer   . Low back pain   . Low HDL (under 40)   . Pre-diabetes   . Rectal bleeding     Past Surgical History:  Procedure Laterality Date  . BREAST RECONSTRUCTION WITH PLACEMENT OF TISSUE EXPANDER AND ALLODERM Right 05/16/2019   Procedure: RIGHT BREAST RECONSTRUCTION WITH PLACEMENT OF TISSUE EXPANDER AND ALLODERM;  Surgeon: Irene Limbo, MD;  Location: [place];  Service: Plastics;  Laterality: Right;  . CESAREAN SECTION    . HEMORRHOID SURGERY    . NIPPLE SPARING MASTECTOMY WITH SENTINEL LYMPH NODE BIOPSY Right 05/16/2019   Procedure: RIGHT NIPPLE SPARING MASTECTOMY WITH RIGHT AXILLARY SENTINEL LYMPH NODE BIOPSY RIGHT SEED GUIDED NODE EXCISION;  Surgeon: Rolm Bookbinder, MD;  Location: [place];  Service: General;  Laterality: Right;  . TONSILLECTOMY    . TONSILLITIS      There were no vitals filed for this visit.  Subjective Assessment - 07/31/19 1022    Subjective  Patient underwent a right nipple sparing mastectomy and sentinel node biopsy (3/3 nodes positive) with an expander placed on 05/16/2019. She then went to [facility] for further surgery and had 19 more nodes removed and found 2  to be positive. She also had her nipple removed due to finding cancer in it. She began chemotherapy 06/24/2019 and is on Adriamyacin, Cytoxin and will have Taxol after one more chemo cycle.    Pertinent History  Patient was diagnosed on 03/14/2019 with right grade I invasive ductal carcinoma breast cancer. It is ER/PR positive and HER2 negative with a Ki67 of 40%. Patient underwent a right nipple sparing mastectomy and sentinel node biopsy (3/3 nodes positive) with an expander placed on 05/16/2019. She then went to [facility] for further surgery and had 19 more nodes removed and found 2 to be positive. She also had her nipple removed due to finding cancer in it.    Patient Stated Goals  Get my arm moving better and check on my swelling    Currently in Pain?  Yes    Pain Score  5     Pain Location  Scrotum    Pain Orientation  Right    Pain Descriptors / Indicators  Tightness    Pain Type  Surgical pain    Pain Onset  More than a month ago    Pain Frequency  Intermittent    Aggravating Factors   Stretching / reaching    Pain Relieving Factors  Rest    Multiple Pain Sites  No  [facility] PT Assessment - 07/31/19 0001      Assessment   Medical Diagnosis  s/p right mastectomy and ALND    Referring Provider (PT)  Dr. Josie Dixon     Onset Date/Surgical Date  05/16/19    Hand Dominance  Right    Prior Therapy  Baselines      Precautions   Precautions  Other (comment)    Precaution Comments  recent surgery; ctive chemo, high risk right arm lymphedema      Restrictions   Weight Bearing Restrictions  No      Balance Screen   Has the patient fallen in the past 6 months  No    Has the patient had a decrease in activity level because of a fear of falling?   No    Is the patient reluctant to leave their home because of a fear of falling?   No      Home Environment   Living Environment  Private residence    Living Arrangements  Spouse/significant other    Available Help at Discharge   Family      Prior Function   Level of Independence  Independent    Vocation  Part time employment    Engineer, materials    Leisure  She is walking most days but can not do it during chemo week.      Cognition   Overall Cognitive Status  Within Functional Limits for tasks assessed      Observation/Other Assessments   Observations  Right breast and axillary incisions both appear to be healing well. Axillary cording present and visible from right axilla extending just distal to her anterior elbow. Photo taken.      Posture/Postural Control   Posture/Postural Control  Postural limitations    Postural Limitations  Rounded Shoulders;Forward head      ROM / Strength   AROM / PROM / Strength  AROM      AROM   AROM Assessment Site  Shoulder    Right/Left Shoulder  Right    Right Shoulder Extension  45 Degrees    Right Shoulder Flexion  108 Degrees    Right Shoulder ABduction  108 Degrees    Right Shoulder Internal Rotation  70 Degrees    Right Shoulder External Rotation  81 Degrees      Strength   Overall Strength  Unable to assess;Due to precautions        LYMPHEDEMA/ONCOLOGY QUESTIONNAIRE - 07/31/19 1033      Type   Cancer Type  Right breast cancer      Surgeries   Mastectomy Date  05/16/19    Sentinel Lymph Node Biopsy Date  05/16/19    Axillary Lymph Node Dissection Date  06/05/19    Number Lymph Nodes Removed  22   5/22 positive     Treatment   Active Chemotherapy Treatment  Yes    Date  06/24/19    Past Chemotherapy Treatment  No    Active Radiation Treatment  No    Past Radiation Treatment  No    Current Hormone Treatment  No    Past Hormone Therapy  No      What other symptoms do you have   Are you Having Heaviness or Tightness  Yes    Are you having Pain  Yes    Are you having pitting edema  No    Is it Hard or Difficult finding clothes that fit  No  Do you have infections  No    Is there Decreased scar mobility  Yes    Stemmer Sign   No      Right Upper Extremity Lymphedema   15 cm Proximal to Olecranon Process  31.9 cm    10 cm Proximal to Olecranon Process  31.5 cm    Olecranon Process  27.8 cm    10 cm Proximal to Ulnar Styloid Process  24.5 cm    Just Proximal to Ulnar Styloid Process  15.6 cm    Across Hand at [employer]  19.9 cm    At [place] of 2nd Digit  6 cm      Left Upper Extremity Lymphedema   15 cm Proximal to Olecranon Process  33 cm    10 cm Proximal to Olecranon Process  32.5 cm    Olecranon Process  27.8 cm    10 cm Proximal to Ulnar Styloid Process  22.5 cm    Just Proximal to Ulnar Styloid Process  15 cm    Across Hand at [employer]  19.1 cm    At [place] of 2nd Digit  5.9 cm      Axillary Cording:        Katina Dung - 07/31/19 0001    Open a tight or new jar  Mild difficulty    Do heavy household chores (wash walls, wash floors)  Moderate difficulty    Carry a shopping bag or briefcase  Mild difficulty    Wash your back  Moderate difficulty    Use a knife to cut food  No difficulty    Recreational activities in which you take some force or impact through your arm, shoulder, or hand (golf, hammering, tennis)  Unable    During the past week, to what extent has your arm, shoulder or hand problem interfered with your normal social activities with family, friends, neighbors, or groups?  Slightly    During the past week, to what extent has your arm, shoulder or hand problem limited your work or other regular daily activities  Slightly    Arm, shoulder, or hand pain.  Moderate    Tingling (pins and needles) in your arm, shoulder, or hand  Moderate    Difficulty Sleeping  No difficulty    DASH Score  36.36 %                     PT Education - 07/31/19 1055    Education Details  reviewed previously given HEP and instructed her to do AAROM flexion and ER in supine instead of standing. Supine cane abduction as well.    Person(s) Educated  Patient    Methods   Explanation;Demonstration;Handout    Comprehension  Returned demonstration;Verbalized understanding          PT Long Term Goals - 07/31/19 1212      PT LONG TERM GOAL #1   Title  Patient will demonstrate she has regained full shoulder ROM and function post operatively compared to baselines.    Time  4    Period  Weeks    Status  On-going    Target Date  08/28/19      PT LONG TERM GOAL #2   Title  Patient will increase right shoulder active flexion to >/= 145 degrees to increased ease reaching.    Baseline  108 post op    Time  4    Status  New  Target Date  08/28/19      PT LONG TERM GOAL #3   Title  Patient will increase right shoulder active abduction to >/= 145 degrees to increased ease reaching and obtaining radiation positioning.    Baseline  108 degrees post op    Time  4    Period  Weeks    Status  New    Target Date  08/28/19      PT LONG TERM GOAL #4   Title  Patient will improve DASH score to be </= 10 for increased ease with all upper extremity function.    Baseline  36.36    Time  4    Period  Weeks    Status  New    Target Date  08/28/19      PT LONG TERM GOAL #5   Title  Patient will verbalize risk reduction practices for lymphedema.    Time  4    Period  Weeks    Target Date  08/28/19            Plan - 07/31/19 1157    Clinical Impression Statement  Patient is doing as well as expected following 2 surgeries for her breast cancer. She underwent a right nipple sparing mastectomy and sentinel node biopsy with Dr. Donne Hazel on 05/16/2019. She had 3/3 nodes positive. She then transferred her care to [facility]. She underwent a second surgery on 06/05/2019 to have an axillary lymph node dissection and had 2/19 positive axillary nodes. She began chemotherapy on 06/24/2019 and had 1 more cycle of adriamyacin and cytoxin and then will have 12 weeks of Taxol. This will be followed by radiation to include her right axilla. Her shoulder ROM is very limited and she  has early signs of lymphedema. She will benefit from physical therapy to regain shoulder mobility back to baseline and to tolerate radiation positioning and also have her lymphedema addressed. Her circumferential measurements are not significantly different but her right hand tendons are less visible than her left and she reports a feeling of fullness. We discussed getting her fitted for a compression sleeve and glove as soon as possible and she was agreeable to that. An order was faxe to her physician at [facility] and her demographics faxed to [facility] per her request.    PT Frequency  2x / week    PT Duration  4 weeks    PT Treatment/Interventions  ADLs/Self Care Home Management;Therapeutic exercise;Patient/family education;Passive range of motion;Manual techniques;Manual lymph drainage;Scar mobilization;Compression bandaging    PT Next Visit Plan  Begin PROM right shoulder and myofascial release for cording. Manual lymph drainage to right arm if time permits. Get her signed up for ABC class.    PT Home Exercise Plan  Post op shoulder ROM HEP and supine cane abduction    Recommended Other Services  Demographics sent to [facility] to be fitted for class II compression sleeve and glove.    Consulted and Agree with Plan of Care  Patient       Patient will benefit from skilled therapeutic intervention in order to improve the following deficits and impairments:  Postural dysfunction, Decreased range of motion, Increased fascial restricitons, Impaired UE functional use, Pain, Decreased knowledge of precautions, Decreased scar mobility, Increased edema  Visit Diagnosis: Malignant neoplasm of upper-outer quadrant of right breast in female, estrogen receptor positive (HCC)  Abnormal posture  Stiffness of right shoulder, not elsewhere classified  Aftercare following surgery for neoplasm  Postmastectomy lymphedema  Problem List Patient Active Problem List   Diagnosis Date Noted  . S/P mastectomy,  right 05/16/2019  . Genetic testing 04/02/2019  . Family history of uterine cancer   . Malignant neoplasm of upper-outer quadrant of right breast in female, estrogen receptor positive ([place]) 03/25/2019   Annia Friendly, PT 07/31/19 12:19 PM  [place], [place], [address] Phone: [phone]   Fax:  [phone]  Name: Krystal Davis MRN: [id number] Date of Birth: Nov 17, 1970

## 2019-08-04 ENCOUNTER — Encounter: Payer: Self-pay | Admitting: Physical Therapy

## 2019-08-04 ENCOUNTER — Other Ambulatory Visit: Payer: Self-pay

## 2019-08-04 ENCOUNTER — Ambulatory Visit: Payer: BC Managed Care – PPO | Admitting: Physical Therapy

## 2019-08-04 DIAGNOSIS — R293 Abnormal posture: Secondary | ICD-10-CM

## 2019-08-04 DIAGNOSIS — C50411 Malignant neoplasm of upper-outer quadrant of right female breast: Secondary | ICD-10-CM | POA: Diagnosis not present

## 2019-08-04 DIAGNOSIS — M25611 Stiffness of right shoulder, not elsewhere classified: Secondary | ICD-10-CM

## 2019-08-04 DIAGNOSIS — Z483 Aftercare following surgery for neoplasm: Secondary | ICD-10-CM

## 2019-08-04 DIAGNOSIS — I972 Postmastectomy lymphedema syndrome: Secondary | ICD-10-CM

## 2019-08-04 NOTE — Therapy (Signed)
[place], [place], [address] Phone: [phone]   Fax:  [phone]  Physical Therapy Treatment  Patient Details  Name: Krystal Davis MRN: [id number] Date of Birth: [birth year] Referring Provider (PT): Dr. Josie Dixon    Encounter Date: 08/04/2019  PT End of Session - 08/04/19 1220    Visit Number  3    Number of Visits  10    Date for PT Re-Evaluation  08/28/19    PT Start Time  1110    PT Stop Time  1205    PT Time Calculation (min)  55 min    Activity Tolerance  Patient tolerated treatment well    Behavior During Therapy  [facility] for tasks assessed/performed       Past Medical History:  Diagnosis Date  . Abnormal Pap smear of vagina   . Cancer [facility])    Breast Cancer  . Family history of uterine cancer   . Low back pain   . Low HDL (under 40)   . Pre-diabetes   . Rectal bleeding     Past Surgical History:  Procedure Laterality Date  . BREAST RECONSTRUCTION WITH PLACEMENT OF TISSUE EXPANDER AND ALLODERM Right 05/16/2019   Procedure: RIGHT BREAST RECONSTRUCTION WITH PLACEMENT OF TISSUE EXPANDER AND ALLODERM;  Surgeon: Irene Limbo, MD;  Location: [place];  Service: Plastics;  Laterality: Right;  . CESAREAN SECTION    . HEMORRHOID SURGERY    . NIPPLE SPARING MASTECTOMY WITH SENTINEL LYMPH NODE BIOPSY Right 05/16/2019   Procedure: RIGHT NIPPLE SPARING MASTECTOMY WITH RIGHT AXILLARY SENTINEL LYMPH NODE BIOPSY RIGHT SEED GUIDED NODE EXCISION;  Surgeon: Rolm Bookbinder, MD;  Location: [place];  Service: General;  Laterality: Right;  . TONSILLECTOMY    . TONSILLITIS      There were no vitals filed for this visit.  Subjective Assessment - 08/04/19 1124    Subjective  Pt states her hand and arm are a little more swollen. She has been doing her exercises    Pertinent History  Patient was diagnosed on 03/14/2019 with right grade I invasive ductal carcinoma breast cancer. It is ER/PR positive  and HER2 negative with a Ki67 of 40%. Patient underwent a right nipple sparing mastectomy and sentinel node biopsy (3/3 nodes positive) with an expander placed on 05/16/2019. She then went to [facility] for further surgery and had 19 more nodes removed and found 2 to be positive. She also had her nipple removed due to finding cancer in it.    Patient Stated Goals  Get my arm moving better and check on my swelling    Currently in Pain?  No/denies            LYMPHEDEMA/ONCOLOGY QUESTIONNAIRE - 08/04/19 1120      Right Upper Extremity Lymphedema   15 cm Proximal to Olecranon Process  33.5 cm    10 cm Proximal to Olecranon Process  32 cm    Olecranon Process  27 cm    15 cm Proximal to Ulnar Styloid Process  28.5 cm    10 cm Proximal to Ulnar Styloid Process  25 cm    Just Proximal to Ulnar Styloid Process  16.2 cm    Across Hand at [employer]  20.6 cm    At [place] of 2nd Digit  6 cm                [facility] Adult PT Treatment/Exercise - 08/04/19 0001  Manual Therapy   Manual Therapy  Edema management;Manual Lymphatic Drainage (MLD);Compression Bandaging;Passive ROM    Manual therapy comments  educated in elevation of hand and forearm with ROM exercises to forearme, wrist, hand and fingers. in and out of bandage,  pt also educated she can wear the tg soft alone if bandages are removed,     Edema Management  remeasured right arm, pt has increases since last visit.  Cording still evident     Manual Lymphatic Drainage (MLD)  In supine, short neck on right side only superficial and deep abdominals. right inguinal nodes and right axillo-inguinal anastamosis, right shoulder upper arm to elbow and hand with return along pathways     Compression Bandaging  lotion applied to arm, medium tg soft, elastomull to fingeres 1-2 with extra turns around thenar space, artiflex, the 6 , 8, 10 cm short stretch bandage from hand to axilla     Passive ROM  to right shoulder in all planes.  some ROM  for [place] done with elbow flexed and some done with elbow extended to stretch cord              PT Education - 08/04/19 1219    Education Details  bring all bandages back to next visit, elevation and elevation of arm to control lymphedema    Person(s) Educated  Patient    Methods  Explanation    Comprehension  Verbalized understanding          PT Long Term Goals - 07/31/19 1212      PT LONG TERM GOAL #1   Title  Patient will demonstrate she has regained full shoulder ROM and function post operatively compared to baselines.    Time  4    Period  Weeks    Status  On-going    Target Date  08/28/19      PT LONG TERM GOAL #2   Title  Patient will increase right shoulder active flexion to >/= 145 degrees to increased ease reaching.    Baseline  108 post op    Time  4    Status  New    Target Date  08/28/19      PT LONG TERM GOAL #3   Title  Patient will increase right shoulder active abduction to >/= 145 degrees to increased ease reaching and obtaining radiation positioning.    Baseline  108 degrees post op    Time  4    Period  Weeks    Status  New    Target Date  08/28/19      PT LONG TERM GOAL #4   Title  Patient will improve DASH score to be </= 10 for increased ease with all upper extremity function.    Baseline  36.36    Time  4    Period  Weeks    Status  New    Target Date  08/28/19      PT LONG TERM GOAL #5   Title  Patient will verbalize risk reduction practices for lymphedema.    Time  4    Period  Weeks    Target Date  08/28/19            Plan - 08/04/19 1220    Clinical Impression Statement  Pt has had increased swelling in right arm and hand with increase in almost all measurements.  She cannot get measured for sleeve until next Monday so MLD, compression bandaging and remedial exercises started today.  Stability/Clinical Decision Making  Stable/Uncomplicated    Rehab Potential  Excellent    PT Frequency  2x / week    PT Duration  4 weeks     PT Treatment/Interventions  ADLs/Self Care Home Management;Therapeutic exercise;Patient/family education;Passive range of motion;Manual techniques;Manual lymph drainage;Scar mobilization;Compression bandaging    PT Next Visit Plan  assesss how she did with bandaging cont. PROM right shoulder and myofascial release for cording. contManual lymph drainage to right arm and compression bandaging . Get her signed up for ABC class.       Patient will benefit from skilled therapeutic intervention in order to improve the following deficits and impairments:  Postural dysfunction, Decreased range of motion, Increased fascial restricitons, Impaired UE functional use, Pain, Decreased knowledge of precautions, Decreased scar mobility, Increased edema  Visit Diagnosis: Abnormal posture  Stiffness of right shoulder, not elsewhere classified  Aftercare following surgery for neoplasm  Postmastectomy lymphedema     Problem List Patient Active Problem List   Diagnosis Date Noted  . S/P mastectomy, right 05/16/2019  . Genetic testing 04/02/2019  . Family history of uterine cancer   . Malignant neoplasm of upper-outer quadrant of right breast in female, estrogen receptor positive ([place]) 03/25/2019   Donato Heinz. Owens Shark PT  Norwood Levo 08/04/2019, 12:25 PM  [place], [place], [address] Phone: [phone]   Fax:  [phone]  Name: Krystal Davis MRN: [id number] Date of Birth: 1970/09/12

## 2019-08-07 ENCOUNTER — Ambulatory Visit: Payer: BC Managed Care – PPO | Admitting: Physical Therapy

## 2019-08-07 ENCOUNTER — Other Ambulatory Visit: Payer: Self-pay

## 2019-08-07 ENCOUNTER — Encounter: Payer: Self-pay | Admitting: Physical Therapy

## 2019-08-07 DIAGNOSIS — I972 Postmastectomy lymphedema syndrome: Secondary | ICD-10-CM

## 2019-08-07 DIAGNOSIS — R293 Abnormal posture: Secondary | ICD-10-CM

## 2019-08-07 DIAGNOSIS — C50411 Malignant neoplasm of upper-outer quadrant of right female breast: Secondary | ICD-10-CM | POA: Diagnosis not present

## 2019-08-07 DIAGNOSIS — Z483 Aftercare following surgery for neoplasm: Secondary | ICD-10-CM

## 2019-08-07 DIAGNOSIS — M25611 Stiffness of right shoulder, not elsewhere classified: Secondary | ICD-10-CM

## 2019-08-07 NOTE — Patient Instructions (Signed)
[email or web address]  Resources ( tab on far right) Self care videos   Self MLD upper extremity   Self bandaging

## 2019-08-07 NOTE — Therapy (Signed)
[place], [place], [address] Phone: [phone]   Fax:  [phone]  Physical Therapy Treatment  Patient Details  Name: Krystal Davis MRN: [id number] Date of Birth: 1970-10-26 Referring Provider (PT): Dr. Josie Dixon    Encounter Date: 08/07/2019  PT End of Session - 08/07/19 1624    Visit Number  4    Number of Visits  10    Date for PT Re-Evaluation  08/28/19    PT Start Time  5427    PT Stop Time  1500    PT Time Calculation (min)  55 min    Activity Tolerance  Patient tolerated treatment well    Behavior During Therapy  [facility] for tasks assessed/performed       Past Medical History:  Diagnosis Date  . Abnormal Pap smear of vagina   . Cancer [facility])    Breast Cancer  . Family history of uterine cancer   . Low back pain   . Low HDL (under 40)   . Pre-diabetes   . Rectal bleeding     Past Surgical History:  Procedure Laterality Date  . BREAST RECONSTRUCTION WITH PLACEMENT OF TISSUE EXPANDER AND ALLODERM Right 05/16/2019   Procedure: RIGHT BREAST RECONSTRUCTION WITH PLACEMENT OF TISSUE EXPANDER AND ALLODERM;  Surgeon: Irene Limbo, MD;  Location: [place];  Service: Plastics;  Laterality: Right;  . CESAREAN SECTION    . HEMORRHOID SURGERY    . NIPPLE SPARING MASTECTOMY WITH SENTINEL LYMPH NODE BIOPSY Right 05/16/2019   Procedure: RIGHT NIPPLE SPARING MASTECTOMY WITH RIGHT AXILLARY SENTINEL LYMPH NODE BIOPSY RIGHT SEED GUIDED NODE EXCISION;  Surgeon: Rolm Bookbinder, MD;  Location: [place];  Service: General;  Laterality: Right;  . TONSILLECTOMY    . TONSILLITIS      There were no vitals filed for this visit.  Subjective Assessment - 08/07/19 1619    Subjective  Pt reports her hand is a little better and she has tried to wrap it at home. She comes in wearing tg soft    Pertinent History  Patient was diagnosed on 03/14/2019 with right grade I invasive ductal carcinoma breast cancer. It  is ER/PR positive and HER2 negative with a Ki67 of 40%. Patient underwent a right nipple sparing mastectomy and sentinel node biopsy (3/3 nodes positive) with an expander placed on 05/16/2019. She then went to [facility] for further surgery and had 19 more nodes removed and found 2 to be positive. She also had her nipple removed due to finding cancer in it.    Patient Stated Goals  Get my arm moving better and check on my swelling    Currently in Pain?  No/denies            LYMPHEDEMA/ONCOLOGY QUESTIONNAIRE - 08/07/19 1410      Right Upper Extremity Lymphedema   15 cm Proximal to Olecranon Process  33 cm    10 cm Proximal to Olecranon Process  32 cm    Olecranon Process  27.5 cm    15 cm Proximal to Ulnar Styloid Process  28 cm    10 cm Proximal to Ulnar Styloid Process  24.4 cm    Just Proximal to Ulnar Styloid Process  16 cm    Across Hand at [employer]  20.1 cm    At [place] of 2nd Digit  6 cm                [facility] Adult PT Treatment/Exercise -  08/07/19 0001      Manual Therapy   Manual Therapy  Edema management;Manual Lymphatic Drainage (MLD);Compression Bandaging;Passive ROM    Manual therapy comments  showed pt and gave her links to [email or web address] website for self care videos for self MLD and bandaging     Edema Management  remeasured right arm, pt has increases since last visit.  Cording less evident.     Manual Lymphatic Drainage (MLD)  In supine, short neck on right side only superficial and deep abdominals. right inguinal nodes and right axillo-inguinal anastamosis, right shoulder upper arm to elbow and hand with return along pathways     Compression Bandaging  lotio medium tg soft, elastomull to fingeres 1-4 with thin foam around thumb to apply more compression to thenar eminence and  extra turns around thenar space, artiflex, the 6 , 8, and 2 -10 cm short stretch bandage from hand to axilla  with herringbone wiht 8 cm at forearm. Pt paying close attention to how to  bandage and feels she will be able to do it at home     Passive ROM  to right shoulder in all planes.  some ROM for [place] done with elbow flexed and some done with elbow extended to stretch cord              PT Education - 08/07/19 1623    Education Details  how to get to online self care videos for self MLD and bandaging    Person(s) Educated  Patient    Methods  Explanation;Handout    Comprehension  Verbalized understanding          PT Long Term Goals - 07/31/19 1212      PT LONG TERM GOAL #1   Title  Patient will demonstrate she has regained full shoulder ROM and function post operatively compared to baselines.    Time  4    Period  Weeks    Status  On-going    Target Date  08/28/19      PT LONG TERM GOAL #2   Title  Patient will increase right shoulder active flexion to >/= 145 degrees to increased ease reaching.    Baseline  108 post op    Time  4    Status  New    Target Date  08/28/19      PT LONG TERM GOAL #3   Title  Patient will increase right shoulder active abduction to >/= 145 degrees to increased ease reaching and obtaining radiation positioning.    Baseline  108 degrees post op    Time  4    Period  Weeks    Status  New    Target Date  08/28/19      PT LONG TERM GOAL #4   Title  Patient will improve DASH score to be </= 10 for increased ease with all upper extremity function.    Baseline  36.36    Time  4    Period  Weeks    Status  New    Target Date  08/28/19      PT LONG TERM GOAL #5   Title  Patient will verbalize risk reduction practices for lymphedema.    Time  4    Period  Weeks    Target Date  08/28/19            Plan - 08/07/19 1625    Clinical Impression Statement  Pt has had some early reduction in her right  arm circumference and has increased her PROM.  She is demonstrating she should be able to manage her arm at home and is looking forward to getting measured for her day and night garments next week    Stability/Clinical  Decision Making  Stable/Uncomplicated    PT Frequency  2x / week    PT Duration  4 weeks    PT Next Visit Plan  assesss how she did with bandaging cont. PROM right shoulder and myofascial release for cording. contManual lymph drainage to right arm and compression bandaging . Get her signed up for ABC class.       Patient will benefit from skilled therapeutic intervention in order to improve the following deficits and impairments:  Postural dysfunction, Decreased range of motion, Increased fascial restricitons, Impaired UE functional use, Pain, Decreased knowledge of precautions, Decreased scar mobility, Increased edema  Visit Diagnosis: Abnormal posture  Stiffness of right shoulder, not elsewhere classified  Aftercare following surgery for neoplasm  Postmastectomy lymphedema     Problem List Patient Active Problem List   Diagnosis Date Noted  . S/P mastectomy, right 05/16/2019  . Genetic testing 04/02/2019  . Family history of uterine cancer   . Malignant neoplasm of upper-outer quadrant of right breast in female, estrogen receptor positive ([place]) 03/25/2019   Donato Heinz. Owens Shark PT  Norwood Levo 08/07/2019, 4:27 PM  [place], [place], [address] Phone: [phone]   Fax:  [phone]  Name: Krystal Davis MRN: [id number] Date of Birth: 24-Jun-1971

## 2019-08-11 ENCOUNTER — Other Ambulatory Visit: Payer: Self-pay

## 2019-08-11 ENCOUNTER — Ambulatory Visit: Payer: BC Managed Care – PPO | Admitting: Physical Therapy

## 2019-08-11 ENCOUNTER — Encounter: Payer: Self-pay | Admitting: Physical Therapy

## 2019-08-11 DIAGNOSIS — C50411 Malignant neoplasm of upper-outer quadrant of right female breast: Secondary | ICD-10-CM | POA: Diagnosis not present

## 2019-08-11 DIAGNOSIS — Z483 Aftercare following surgery for neoplasm: Secondary | ICD-10-CM

## 2019-08-11 DIAGNOSIS — I972 Postmastectomy lymphedema syndrome: Secondary | ICD-10-CM

## 2019-08-11 DIAGNOSIS — M25611 Stiffness of right shoulder, not elsewhere classified: Secondary | ICD-10-CM

## 2019-08-11 DIAGNOSIS — R293 Abnormal posture: Secondary | ICD-10-CM

## 2019-08-11 NOTE — Patient Instructions (Signed)
Over Head Pull: Narrow Grip     [place] [phone]   On back, knees bent, feet flat, band across thighs, elbows straight but relaxed. Pull hands apart (start). Keeping elbows straight, bring arms up and over head, hands toward floor. Keep pull steady on band. Hold momentarily. Return slowly, keeping pull steady, back to start. Repeat ___ times. Band color ______   Side Pull: Double Arm   On back, knees bent, feet flat. Arms perpendicular to body, shoulder level, elbows straight but relaxed. Pull arms out to sides, elbows straight. Resistance band comes across collarbones, hands toward floor. Hold momentarily. Slowly return to starting position. Repeat ___ times. Band color _____   Sash   On back, knees bent, feet flat, left hand on left hip, right hand above left. Pull right arm DIAGONALLY (hip to shoulder) across chest. Bring right arm along head toward floor. Hold momentarily. Slowly return to starting position. Repeat ___ times. Do with left arm. Band color ______   Shoulder Rotation: Double Arm   On back, knees bent, feet flat, elbows tucked at sides, bent 90, hands palms up. Pull hands apart and down toward floor, keeping elbows near sides. Hold momentarily. Slowly return to starting position. Repeat ___ times. Band color ______   

## 2019-08-11 NOTE — Therapy (Signed)
[facility] [address] [place], , [address] Phone: [phone]   Fax:  [phone]  Physical Therapy Treatment  Patient Details  Name: Krystal Davis MRN: [id number] Date of Birth: 08/06/1971 Referring Provider (PT): Dr. Emily Douglas    Encounter Date: 08/11/2019  PT End of Session - 08/11/19 1200    Visit Number  5    Number of Visits  10    Date for PT Re-Evaluation  08/28/19    PT Start Time  0900    PT Stop Time  0945    PT Time Calculation (min)  45 min    Activity Tolerance  Patient tolerated treatment well    Behavior During Therapy  [facility] for tasks assessed/performed       Past Medical History:  Diagnosis Date  . Abnormal Pap smear of vagina   . Cancer ([facility])    Breast Cancer  . Family history of uterine cancer   . Low back pain   . Low HDL (under 40)   . Pre-diabetes   . Rectal bleeding     Past Surgical History:  Procedure Laterality Date  . BREAST RECONSTRUCTION WITH PLACEMENT OF TISSUE EXPANDER AND ALLODERM Right 05/16/2019   Procedure: RIGHT BREAST RECONSTRUCTION WITH PLACEMENT OF TISSUE EXPANDER AND ALLODERM;  Surgeon: Thimmappa, Brinda, MD;  [facility];  Service: Plastics;  Laterality: Right;  . CESAREAN SECTION    . HEMORRHOID SURGERY    . NIPPLE SPARING MASTECTOMY WITH SENTINEL LYMPH NODE BIOPSY Right 05/16/2019   Procedure: RIGHT NIPPLE SPARING MASTECTOMY WITH RIGHT AXILLARY SENTINEL LYMPH NODE BIOPSY RIGHT SEED GUIDED NODE EXCISION;  Surgeon: Wakefield, Matthew, MD;  Location: [facility];  Service: General;  Laterality: Right;  . TONSILLECTOMY    . TONSILLITIS      There were no vitals filed for this visit.  Subjective Assessment - 08/11/19 0916    Subjective  Pt had pain with the bandaging in her lateral hand and well as around the thumb. Thumb pain continues. she aslo had pain in shoulder and lateral chest with bandage.  She has been doing her shoulder exercises at home. Her hand  looks better today and she will be measured for garments    Pertinent History  Patient was diagnosed on 03/14/2019 with right grade I invasive ductal carcinoma breast cancer. It is ER/PR positive and HER2 negative with a Ki67 of 40%. Patient underwent a right nipple sparing mastectomy and sentinel node biopsy (3/3 nodes positive) with an expander placed on 05/16/2019. She then went to [place] for further surgery and had 19 more nodes removed and found 2 to be positive. She also had her nipple removed due to finding cancer in it.    Currently in Pain?  Yes    Pain Score  3     Pain Location  --   thumb        [facility] PT Assessment - 08/11/19 0001      AROM   Right Shoulder Flexion  141 Degrees    Right Shoulder ABduction  132 Degrees    Right Shoulder External Rotation  95 Degrees        LYMPHEDEMA/ONCOLOGY QUESTIONNAIRE - 08/11/19 0918      Right Upper Extremity Lymphedema   15 cm Proximal to Olecranon Process  32.8 cm    10 cm Proximal to Olecranon Process  32 cm    Olecranon Process  27.5 cm    15 cm Proximal to Ulnar Styloid   Process  27 cm    10 cm Proximal to Ulnar Styloid Process  24 cm    Just Proximal to Ulnar Styloid Process  15.6 cm    Across Hand at Thumb Web Space  19.7 cm    At Base of 2nd Digit  6 cm                [facility] Adult PT Treatment/Exercise - 08/11/19 0001      Exercises   Exercises  Shoulder      Shoulder Exercises: Supine   Protraction  AROM;Right;Left;10 reps    Horizontal ABduction  Strengthening;Right;Left;5 reps;Theraband    Theraband Level (Shoulder Horizontal ABduction)  Level 1 (Yellow)   hands at end of band for low resistance    External Rotation  Strengthening;Right;Left;5 reps;Theraband    Theraband Level (Shoulder External Rotation)  Level 1 (Yellow)    Flexion  Strengthening;Right;Left;5 reps;Theraband   narrow and wide grip    Theraband Level (Shoulder Flexion)  Level 1 (Yellow)    Diagonals  Strengthening;Right;Left;5  reps;Theraband    Theraband Level (Shoulder Diagonals)  Level 1 (Yellow)      Manual Therapy   Edema Management  remeasured right arm. consulted with garment fitter and pt for garments  talked with pt about Flexitouch     Manual Lymphatic Drainage (MLD)  briefly MLD to right upper arm              PT Education - 08/11/19 1200    Education Details  supine scapular series with yellow band    Person(s) Educated  Patient    Methods  Explanation;Demonstration;Handout    Comprehension  Verbalized understanding;Returned demonstration          PT Long Term Goals - 07/31/19 1212      PT LONG TERM GOAL #1   Title  Patient will demonstrate she has regained full shoulder ROM and function post operatively compared to baselines.    Time  4    Period  Weeks    Status  On-going    Target Date  08/28/19      PT LONG TERM GOAL #2   Title  Patient will increase right shoulder active flexion to >/= 145 degrees to increased ease reaching.    Baseline  108 post op    Time  4    Status  New    Target Date  08/28/19      PT LONG TERM GOAL #3   Title  Patient will increase right shoulder active abduction to >/= 145 degrees to increased ease reaching and obtaining radiation positioning.    Baseline  108 degrees post op    Time  4    Period  Weeks    Status  New    Target Date  08/28/19      PT LONG TERM GOAL #4   Title  Patient will improve DASH score to be </= 10 for increased ease with all upper extremity function.    Baseline  36.36    Time  4    Period  Weeks    Status  New    Target Date  08/28/19      PT LONG TERM GOAL #5   Title  Patient will verbalize risk reduction practices for lymphedema.    Time  4    Period  Weeks    Target Date  08/28/19            Plan - 08/11/19 1201      Clinical Impression Statement  Pt did not tolerate compression bandaging well.  She has decreased circumference measuarements today and was fitted for day and night garments so she was  advised that she does not need to do bandaging at this time and monitor her arm. She is interested in finding out more about the Flexi as she has had 19 nodes removed and is likley to monitor and manage her lymphedema for the rest of her life. Upgraded home exercise today to include strengthening.    Stability/Clinical Decision Making  Stable/Uncomplicated    Rehab Potential  Excellent    PT Frequency  2x / week    PT Duration  4 weeks    PT Treatment/Interventions  ADLs/Self Care Home Management;Therapeutic exercise;Patient/family education;Passive range of motion;Manual techniques;Manual lymph drainage;Scar mobilization;Compression bandaging    PT Next Visit Plan  check goals, progress  right shoulder exercise and myofascial release for cording as needed  contManual lymph drainage to right arm and compression bandaging . Get her signed up for ABC class.    PT Home Exercise Plan  Post op shoulder ROM HEP and supine cane abduction, supine scapular series    Recommended Other Services  sent demographics to [employer]    Consulted and Agree with Plan of Care  Patient       Patient will benefit from skilled therapeutic intervention in order to improve the following deficits and impairments:  Postural dysfunction, Decreased range of motion, Increased fascial restricitons, Impaired UE functional use, Pain, Decreased knowledge of precautions, Decreased scar mobility, Increased edema  Visit Diagnosis: Abnormal posture  Stiffness of right shoulder, not elsewhere classified  Aftercare following surgery for neoplasm  Postmastectomy lymphedema     Problem List Patient Active Problem List   Diagnosis Date Noted  . S/P mastectomy, right 05/16/2019  . Genetic testing 04/02/2019  . Family history of uterine cancer   . Malignant neoplasm of upper-outer quadrant of right breast in female, estrogen receptor positive ([place]) 03/25/2019   Donato Heinz. Owens Shark PT  Norwood Levo 08/11/2019, 12:05  PM  [place], [place], [address] Phone: [phone]   Fax:  [phone]  Name: Krystal Davis MRN: [id number] Date of Birth: 29-May-1971

## 2019-08-14 ENCOUNTER — Encounter: Payer: Self-pay | Admitting: Physical Therapy

## 2019-08-14 ENCOUNTER — Ambulatory Visit: Payer: BC Managed Care – PPO | Admitting: Physical Therapy

## 2019-08-14 ENCOUNTER — Other Ambulatory Visit: Payer: Self-pay | Admitting: Hematology and Oncology

## 2019-08-14 ENCOUNTER — Other Ambulatory Visit: Payer: Self-pay

## 2019-08-14 DIAGNOSIS — I972 Postmastectomy lymphedema syndrome: Secondary | ICD-10-CM

## 2019-08-14 DIAGNOSIS — Z483 Aftercare following surgery for neoplasm: Secondary | ICD-10-CM

## 2019-08-14 DIAGNOSIS — C50411 Malignant neoplasm of upper-outer quadrant of right female breast: Secondary | ICD-10-CM | POA: Diagnosis not present

## 2019-08-14 DIAGNOSIS — M25611 Stiffness of right shoulder, not elsewhere classified: Secondary | ICD-10-CM

## 2019-08-14 DIAGNOSIS — R293 Abnormal posture: Secondary | ICD-10-CM

## 2019-08-14 NOTE — Therapy (Signed)
[place], [place], [address] Phone: [phone]   Fax:  [phone]  Physical Therapy Treatment  Patient Details  Name: Krystal Davis MRN: [id number] Date of Birth: [birth year] Referring Provider (PT): Dr. Josie Dixon    Encounter Date: 08/14/2019  PT End of Session - 08/14/19 1401    Visit Number  6    Number of Visits  10    Date for PT Re-Evaluation  08/28/19    PT Start Time  1300    PT Stop Time  1345    PT Time Calculation (min)  45 min    Activity Tolerance  Patient tolerated treatment well    Behavior During Therapy  [facility] for tasks assessed/performed       Past Medical History:  Diagnosis Date  . Abnormal Pap smear of vagina   . Cancer [facility])    Breast Cancer  . Family history of uterine cancer   . Low back pain   . Low HDL (under 40)   . Pre-diabetes   . Rectal bleeding     Past Surgical History:  Procedure Laterality Date  . BREAST RECONSTRUCTION WITH PLACEMENT OF TISSUE EXPANDER AND ALLODERM Right 05/16/2019   Procedure: RIGHT BREAST RECONSTRUCTION WITH PLACEMENT OF TISSUE EXPANDER AND ALLODERM;  Surgeon: Irene Limbo, MD;  Location: [facility];  Service: Plastics;  Laterality: Right;  . CESAREAN SECTION    . HEMORRHOID SURGERY    . NIPPLE SPARING MASTECTOMY WITH SENTINEL LYMPH NODE BIOPSY Right 05/16/2019   Procedure: RIGHT NIPPLE SPARING MASTECTOMY WITH RIGHT AXILLARY SENTINEL LYMPH NODE BIOPSY RIGHT SEED GUIDED NODE EXCISION;  Surgeon: Rolm Bookbinder, MD;  Location: [place];  Service: General;  Laterality: Right;  . TONSILLECTOMY    . TONSILLITIS      There were no vitals filed for this visit.  Subjective Assessment - 08/14/19 1307    Subjective  pt has quesions about th flexitouch.  She feels her arm is about the same without the bandaging    Pertinent History  Patient was diagnosed on 03/14/2019 with right grade I invasive ductal carcinoma breast cancer. It is ER/PR  positive and HER2 negative with a Ki67 of 40%. Patient underwent a right nipple sparing mastectomy and sentinel node biopsy (3/3 nodes positive) with an expander placed on 05/16/2019. She then went to [facility] for further surgery and had 19 more nodes removed and found 2 to be positive. She also had her nipple removed due to finding cancer in it.    Currently in Pain?  No/denies                       [facility] Adult PT Treatment/Exercise - 08/14/19 0001      Exercises   Exercises  Other Exercises    Other Exercises   gave pt information about [email or web address] gentle yoga classes and also infomration about ABC class.  She will call Rose to schedule the zoom class       Manual Therapy   Manual Lymphatic Drainage (MLD)  In supine, short neck on right side only superficial and deep abdominals. right inguinal nodes and right axillo-inguinal anastamosis, right shoulder upper arm to elbow and hand with return along pathways  then to sidelying for posterior interaxillary anastamosis    reminded pt re:  video online, pt may want daughter to learn            PT Education - 08/14/19 1401  Education Details  [email or web address] and ABC class    Person(s) Educated  Patient    Methods  Explanation;Handout    Comprehension  Verbalized understanding          PT Long Term Goals - 07/31/19 1212      PT LONG TERM GOAL #1   Title  Patient will demonstrate she has regained full shoulder ROM and function post operatively compared to baselines.    Time  4    Period  Weeks    Status  On-going    Target Date  08/28/19      PT LONG TERM GOAL #2   Title  Patient will increase right shoulder active flexion to >/= 145 degrees to increased ease reaching.    Baseline  108 post op    Time  4    Status  New    Target Date  08/28/19      PT LONG TERM GOAL #3   Title  Patient will increase right shoulder active abduction to >/= 145 degrees to increased ease reaching and obtaining radiation  positioning.    Baseline  108 degrees post op    Time  4    Period  Weeks    Status  New    Target Date  08/28/19      PT LONG TERM GOAL #4   Title  Patient will improve DASH score to be </= 10 for increased ease with all upper extremity function.    Baseline  36.36    Time  4    Period  Weeks    Status  New    Target Date  08/28/19      PT LONG TERM GOAL #5   Title  Patient will verbalize risk reduction practices for lymphedema.    Time  4    Period  Weeks    Target Date  08/28/19            Plan - 08/14/19 1402    Clinical Impression Statement  Pt has not had increase in swelling without bandaging but swelling is still present. Focused on MLD with gently PROM to shoulder during treatment.  Arm felt softer after session.    Stability/Clinical Decision Making  Stable/Uncomplicated    Rehab Potential  Excellent    PT Treatment/Interventions  ADLs/Self Care Home Management;Therapeutic exercise;Patient/family education;Passive range of motion;Manual techniques;Manual lymph drainage;Scar mobilization;Compression bandaging    PT Next Visit Plan  check goals, progress  right shoulder exercise and myofascial release for cording as needed  contManual lymph drainage to right arm and compression bandaging . Get her signed up for ABC class.    Recommended Other Services  pt has heard from [employer], but may want to wait until later to move forward with it    Consulted and Agree with Plan of Care  Patient       Patient will benefit from skilled therapeutic intervention in order to improve the following deficits and impairments:  Postural dysfunction, Decreased range of motion, Increased fascial restricitons, Impaired UE functional use, Pain, Decreased knowledge of precautions, Decreased scar mobility, Increased edema  Visit Diagnosis: Abnormal posture  Stiffness of right shoulder, not elsewhere classified  Aftercare following surgery for neoplasm  Postmastectomy  lymphedema     Problem List Patient Active Problem List   Diagnosis Date Noted  . S/P mastectomy, right 05/16/2019  . Genetic testing 04/02/2019  . Family history of uterine cancer   . Malignant neoplasm of upper-outer quadrant of right  breast in female, estrogen receptor positive ([place]) 03/25/2019   Donato Heinz. Owens Shark PT  Norwood Levo 08/14/2019, 2:05 PM  [place], [place], [address] Phone: [phone]   Fax:  [phone]  Name: Krystal Davis MRN: [id number] Date of Birth: [birth year]

## 2019-08-14 NOTE — Patient Instructions (Signed)
[email or web address] Categories Active older adult or tai chi

## 2019-08-18 ENCOUNTER — Other Ambulatory Visit: Payer: Self-pay

## 2019-08-18 ENCOUNTER — Ambulatory Visit: Payer: BC Managed Care – PPO | Admitting: Physical Therapy

## 2019-08-18 DIAGNOSIS — C50411 Malignant neoplasm of upper-outer quadrant of right female breast: Secondary | ICD-10-CM | POA: Diagnosis not present

## 2019-08-18 DIAGNOSIS — R293 Abnormal posture: Secondary | ICD-10-CM

## 2019-08-18 DIAGNOSIS — M25611 Stiffness of right shoulder, not elsewhere classified: Secondary | ICD-10-CM

## 2019-08-18 DIAGNOSIS — Z483 Aftercare following surgery for neoplasm: Secondary | ICD-10-CM

## 2019-08-18 DIAGNOSIS — I972 Postmastectomy lymphedema syndrome: Secondary | ICD-10-CM

## 2019-08-18 NOTE — Therapy (Signed)
[place], [place], [address] Phone: [phone]   Fax:  [phone]  Physical Therapy Treatment  Patient Details  Name: Krystal Davis MRN: [id number] Date of Birth: Jan 24, 1971 Referring Provider (PT): Dr. Josie Dixon    Encounter Date: 08/18/2019  PT End of Session - 08/18/19 1232    Visit Number  7    Number of Visits  10    Date for PT Re-Evaluation  08/28/19    PT Start Time  1100    PT Stop Time  1145    PT Time Calculation (min)  45 min    Activity Tolerance  Patient tolerated treatment well    Behavior During Therapy  [facility] for tasks assessed/performed       Past Medical History:  Diagnosis Date  . Abnormal Pap smear of vagina   . Cancer [facility])    Breast Cancer  . Family history of uterine cancer   . Low back pain   . Low HDL (under 40)   . Pre-diabetes   . Rectal bleeding     Past Surgical History:  Procedure Laterality Date  . BREAST RECONSTRUCTION WITH PLACEMENT OF TISSUE EXPANDER AND ALLODERM Right 05/16/2019   Procedure: RIGHT BREAST RECONSTRUCTION WITH PLACEMENT OF TISSUE EXPANDER AND ALLODERM;  Surgeon: Irene Limbo, MD;  Location: [facility];  Service: Plastics;  Laterality: Right;  . CESAREAN SECTION    . HEMORRHOID SURGERY    . NIPPLE SPARING MASTECTOMY WITH SENTINEL LYMPH NODE BIOPSY Right 05/16/2019   Procedure: RIGHT NIPPLE SPARING MASTECTOMY WITH RIGHT AXILLARY SENTINEL LYMPH NODE BIOPSY RIGHT SEED GUIDED NODE EXCISION;  Surgeon: Rolm Bookbinder, MD;  Location: [facility];  Service: General;  Laterality: Right;  . TONSILLECTOMY    . TONSILLITIS      There were no vitals filed for this visit.  Subjective Assessment - 08/18/19 1230    Subjective  Pt states her hand and arm swelled up after doing housecleaning over the weekend. She brings her daughter in to learn MLD    Pertinent History  Patient was diagnosed on 03/14/2019 with right grade I invasive ductal carcinoma  breast cancer. It is ER/PR positive and HER2 negative with a Ki67 of 40%. Patient underwent a right nipple sparing mastectomy and sentinel node biopsy (3/3 nodes positive) with an expander placed on 05/16/2019. She then went to [facility] for further surgery and had 19 more nodes removed and found 2 to be positive. She also had her nipple removed due to finding cancer in it.    Patient Stated Goals  Get my arm moving better and check on my swelling    Currently in Pain?  Yes    Pain Score  3     Pain Location  Finger (Comment which one)    Pain Orientation  Right    Pain Descriptors / Indicators  Aching    Pain Type  Acute pain    Pain Radiating Towards  no    Pain Onset  In the past 7 days    Pain Frequency  Intermittent    Aggravating Factors   housework and swelling    Pain Relieving Factors  elevation and self MLD                       [facility] Adult PT Treatment/Exercise - 08/18/19 0001      Manual Therapy   Manual Therapy  Manual Lymphatic Drainage (MLD)    Edema  Management  Tg soft to right UE , no thumb hole. continued emailing to [facility] about garments     Manual Lymphatic Drainage (MLD)  Daughter, Denny Peon, came today to learn how to do MLD. She was educated in the basics of physiology and technique for short neck, superficial and deep abdominals, right shoulder upper and to hand and return and then most of time was spent with pt in left sidelying for stationar circles to back and lateral trunk with hand over hand and verbal insturction to [employer] .  She was able to do it well and verbalize when she identified response to treatment in pt tissue              PT Education - 08/18/19 1231    Education Details  manual lymph drainage    Person(s) Educated  Patient;Child(ren)    Methods  Explanation;Demonstration;Tactile cues;Verbal cues    Comprehension  Verbalized understanding;Returned demonstration          PT Long Term Goals - 07/31/19 1212      PT LONG TERM  GOAL #1   Title  Patient will demonstrate she has regained full shoulder ROM and function post operatively compared to baselines.    Time  4    Period  Weeks    Status  On-going    Target Date  08/28/19      PT LONG TERM GOAL #2   Title  Patient will increase right shoulder active flexion to >/= 145 degrees to increased ease reaching.    Baseline  108 post op    Time  4    Status  New    Target Date  08/28/19      PT LONG TERM GOAL #3   Title  Patient will increase right shoulder active abduction to >/= 145 degrees to increased ease reaching and obtaining radiation positioning.    Baseline  108 degrees post op    Time  4    Period  Weeks    Status  New    Target Date  08/28/19      PT LONG TERM GOAL #4   Title  Patient will improve DASH score to be </= 10 for increased ease with all upper extremity function.    Baseline  36.36    Time  4    Period  Weeks    Status  New    Target Date  08/28/19      PT LONG TERM GOAL #5   Title  Patient will verbalize risk reduction practices for lymphedema.    Time  4    Period  Weeks    Target Date  08/28/19            Plan - 08/18/19 1232    Clinical Impression Statement  Pt had increase in hand swelling after housework at home. Her daughter will able to do MLD for her especailly at her back and trunk until she gets flexitouch    Rehab Potential  Excellent    PT Frequency  2x / week    PT Duration  4 weeks    PT Treatment/Interventions  ADLs/Self Care Home Management;Therapeutic exercise;Patient/family education;Passive range of motion;Manual techniques;Manual lymph drainage;Scar mobilization;Compression bandaging    PT Next Visit Plan  check goals, progress  right shoulder exercise and myofascial release for cording as needed  contManual lymph drainage to right arm and compression bandaging . Get her signed up for ABC class.    Consulted and Agree with  Plan of Care  Patient       Patient will benefit from skilled therapeutic  intervention in order to improve the following deficits and impairments:  Postural dysfunction, Decreased range of motion, Increased fascial restricitons, Impaired UE functional use, Pain, Decreased knowledge of precautions, Decreased scar mobility, Increased edema  Visit Diagnosis: Abnormal posture  Stiffness of right shoulder, not elsewhere classified  Aftercare following surgery for neoplasm  Postmastectomy lymphedema     Problem List Patient Active Problem List   Diagnosis Date Noted  . S/P mastectomy, right 05/16/2019  . Genetic testing 04/02/2019  . Family history of uterine cancer   . Malignant neoplasm of upper-outer quadrant of right breast in female, estrogen receptor positive ([place]) 03/25/2019   Donato Heinz. Owens Shark PT  Norwood Levo 08/18/2019, 12:35 PM  [place], [place], [address] Phone: [phone]   Fax:  [phone]  Name: Tyneisha Hegeman MRN: [id number] Date of Birth: 23-Nov-1970

## 2019-08-20 ENCOUNTER — Ambulatory Visit: Payer: BC Managed Care – PPO | Admitting: Physical Therapy

## 2019-08-20 DIAGNOSIS — M25611 Stiffness of right shoulder, not elsewhere classified: Secondary | ICD-10-CM

## 2019-08-20 DIAGNOSIS — I972 Postmastectomy lymphedema syndrome: Secondary | ICD-10-CM

## 2019-08-20 DIAGNOSIS — R293 Abnormal posture: Secondary | ICD-10-CM

## 2019-08-20 DIAGNOSIS — C50411 Malignant neoplasm of upper-outer quadrant of right female breast: Secondary | ICD-10-CM | POA: Diagnosis not present

## 2019-08-20 DIAGNOSIS — Z483 Aftercare following surgery for neoplasm: Secondary | ICD-10-CM

## 2019-08-20 NOTE — Therapy (Signed)
[place], [place], [address] Phone: [phone]   Fax:  [phone]  Physical Therapy Treatment  Patient Details  Name: Krystal Davis MRN: [id number] Date of Birth: [birth year] Referring Provider (PT): Dr. Josie Dixon    Encounter Date: 08/20/2019  PT End of Session - 08/20/19 1227    Visit Number  8    Number of Visits  10    Date for PT Re-Evaluation  08/28/19    PT Start Time  1100    PT Stop Time  1159    PT Time Calculation (min)  59 min    Activity Tolerance  Patient tolerated treatment well    Behavior During Therapy  [facility] for tasks assessed/performed       Past Medical History:  Diagnosis Date  . Abnormal Pap smear of vagina   . Cancer [facility])    Breast Cancer  . Family history of uterine cancer   . Low back pain   . Low HDL (under 40)   . Pre-diabetes   . Rectal bleeding     Past Surgical History:  Procedure Laterality Date  . BREAST RECONSTRUCTION WITH PLACEMENT OF TISSUE EXPANDER AND ALLODERM Right 05/16/2019   Procedure: RIGHT BREAST RECONSTRUCTION WITH PLACEMENT OF TISSUE EXPANDER AND ALLODERM;  Surgeon: Irene Limbo, MD;  Location: [facility];  Service: Plastics;  Laterality: Right;  . CESAREAN SECTION    . HEMORRHOID SURGERY    . NIPPLE SPARING MASTECTOMY WITH SENTINEL LYMPH NODE BIOPSY Right 05/16/2019   Procedure: RIGHT NIPPLE SPARING MASTECTOMY WITH RIGHT AXILLARY SENTINEL LYMPH NODE BIOPSY RIGHT SEED GUIDED NODE EXCISION;  Surgeon: Rolm Bookbinder, MD;  Location: [facility];  Service: General;  Laterality: Right;  . TONSILLECTOMY    . TONSILLITIS      There were no vitals filed for this visit.  Subjective Assessment - 08/20/19 1112    Subjective  Pt says her daughter has done well with the  MLD and taught Tris's husband also.  She says her shoulder is fine now    Pertinent History  Patient was diagnosed on 03/14/2019 with right grade I invasive ductal carcinoma breast  cancer. It is ER/PR positive and HER2 negative with a Ki67 of 40%. Patient underwent a right nipple sparing mastectomy and sentinel node biopsy (3/3 nodes positive) with an expander placed on 05/16/2019. She then went to [facility] for further surgery and had 19 more nodes removed and found 2 to be positive. She also had her nipple removed due to finding cancer in it.    Patient Stated Goals  Get my arm moving better and check on my swelling    Currently in Pain?  No/denies         [facility] PT Assessment - 08/20/19 0001      AROM   Right Shoulder Flexion  151 Degrees    Right Shoulder ABduction  145 Degrees        LYMPHEDEMA/ONCOLOGY QUESTIONNAIRE - 08/20/19 1118      Right Upper Extremity Lymphedema   15 cm Proximal to Olecranon Process  33 cm    10 cm Proximal to Olecranon Process  31.5 cm    Olecranon Process  28 cm    15 cm Proximal to Ulnar Styloid Process  27.3 cm    10 cm Proximal to Ulnar Styloid Process  24.1 cm    Just Proximal to Ulnar Styloid Process  15.9 cm    Across Hand at [employer]  20.5 cm    At [facility] of 2nd Digit  6.5 cm                [facility] Adult PT Treatment/Exercise - 08/20/19 0001      Shoulder Exercises: Supine   Horizontal ABduction  Strengthening;Right;Left;5 reps;Theraband    Theraband Level (Shoulder Horizontal ABduction)  Level 1 (Yellow)    External Rotation  Strengthening;Right;Left;5 reps;Theraband    Theraband Level (Shoulder External Rotation)  Level 1 (Yellow)    Flexion  Strengthening;Right;Left;5 reps;Theraband   narrow and wide grip    Theraband Level (Shoulder Flexion)  Level 1 (Yellow)    Diagonals  Strengthening;Right;Left;5 reps;Theraband    Theraband Level (Shoulder Diagonals)  Level 1 (Yellow)      Manual Therapy   Manual Therapy  Manual Lymphatic Drainage (MLD);Passive ROM    Edema Management  pt found that insurance will cover elastic , but not in elastic garments.  Measured pt for [employer] hand and arm piece and  gave her infomration to order it from compression guru. She will see how she does with Flexitouch and daytime garments before she pays for this out of pocket.     Manual Lymphatic Drainage (MLD)  briely to right anterior and posterior and lateral chest     Passive ROM  to right shoulder in extremes of ROM, especailly into abduction                   PT Long Term Goals - 08/20/19 1113      PT LONG TERM GOAL #1   Title  Patient will demonstrate she has regained full shoulder ROM and function post operatively compared to baselines.    Baseline  08/20/2019 Still limited in abduction    Time  4    Period  Weeks    Status  On-going      PT LONG TERM GOAL #2   Baseline  108 post op,, 151 on 08/20/2019    Time  4    Period  Weeks    Status  Achieved      PT LONG TERM GOAL #3   Title  Patient will increase right shoulder active abduction to >/= 145 degrees to increased ease reaching and obtaining radiation positioning.    Baseline  108 degrees post op,, 145 on 08/20/2019    Status  Achieved      PT LONG TERM GOAL #4   Title  Patient will improve DASH score to be </= 10 for increased ease with all upper extremity function.    Baseline  36.36    Time  4    Status  On-going      PT LONG TERM GOAL #5   Title  Patient will verbalize risk reduction practices for lymphedema.    Time  4    Period  Weeks    Status  On-going            Plan - 08/20/19 1228    Clinical Impression Statement  Pt continues to have fullness in her right hand and arm with measurements that are increased today.  She does not tolerate compression bandaging well, but has been wearing tg soft.  She will be getting a daytime sleeve and glove and Flexitouch trial. Encouraged pt to continue stretching her arm and reinforced supine scapular series.  Will reassess next visit for renewal one time a week    PT Frequency  2x / week    PT Duration  4 weeks    PT Treatment/Interventions  ADLs/Self Care Home  Management;Therapeutic exercise;Patient/family education;Passive range of motion;Manual techniques;Manual lymph drainage;Scar mobilization;Compression bandaging    PT Next Visit Plan  check goals, and renew  progress  right shoulder exercise and myofascial release for cording as needed  contManual lymph drainage to right arm and compression bandaging . Get her signed up for ABC class.    PT Home Exercise Plan  Post op shoulder ROM HEP and supine cane abduction, supine scapular series    Consulted and Agree with Plan of Care  Patient       Patient will benefit from skilled therapeutic intervention in order to improve the following deficits and impairments:  Postural dysfunction, Decreased range of motion, Increased fascial restricitons, Impaired UE functional use, Pain, Decreased knowledge of precautions, Decreased scar mobility, Increased edema  Visit Diagnosis: Abnormal posture  Stiffness of right shoulder, not elsewhere classified  Aftercare following surgery for neoplasm  Postmastectomy lymphedema     Problem List Patient Active Problem List   Diagnosis Date Noted  . S/P mastectomy, right 05/16/2019  . Genetic testing 04/02/2019  . Family history of uterine cancer   . Malignant neoplasm of upper-outer quadrant of right breast in female, estrogen receptor positive ([place]) 03/25/2019   Donato Heinz. Owens Shark PT  Norwood Levo 08/20/2019, 12:33 PM  [place], [place], [address] Phone: [phone]   Fax:  [phone]  Name: Krystal Davis MRN: [id number] Date of Birth: Dec 06, 1970

## 2019-08-20 NOTE — Patient Instructions (Signed)

## 2019-08-28 ENCOUNTER — Encounter: Payer: Self-pay | Admitting: *Deleted

## 2019-09-03 ENCOUNTER — Other Ambulatory Visit: Payer: Self-pay

## 2019-09-03 ENCOUNTER — Ambulatory Visit: Payer: BC Managed Care – PPO | Attending: Hematology & Oncology | Admitting: Physical Therapy

## 2019-09-03 DIAGNOSIS — R293 Abnormal posture: Secondary | ICD-10-CM | POA: Insufficient documentation

## 2019-09-03 DIAGNOSIS — M25611 Stiffness of right shoulder, not elsewhere classified: Secondary | ICD-10-CM

## 2019-09-03 DIAGNOSIS — Z483 Aftercare following surgery for neoplasm: Secondary | ICD-10-CM | POA: Diagnosis present

## 2019-09-03 DIAGNOSIS — I972 Postmastectomy lymphedema syndrome: Secondary | ICD-10-CM | POA: Diagnosis present

## 2019-09-03 NOTE — Patient Instructions (Signed)

## 2019-09-03 NOTE — Therapy (Signed)
[place], [place], [address] Phone: [phone]   Fax:  [phone]  Physical Therapy Treatment  Patient Details  Name: Krystal Davis MRN: [id number] Date of Birth: 03-19-1971 Referring Provider (PT): Dr. Josie Dixon    Encounter Date: 09/03/2019  PT End of Session - 09/03/19 1110    Visit Number  9    Number of Visits  18   decrease frequence as indicated   Date for PT Re-Evaluation  10/24/19    PT Start Time  0803    PT Stop Time  0850    PT Time Calculation (min)  47 min    Activity Tolerance  Patient tolerated treatment well    Behavior During Therapy  [facility] for tasks assessed/performed       Past Medical History:  Diagnosis Date  . Abnormal Pap smear of vagina   . Cancer [facility])    Breast Cancer  . Family history of uterine cancer   . Low back pain   . Low HDL (under 40)   . Pre-diabetes   . Rectal bleeding     Past Surgical History:  Procedure Laterality Date  . BREAST RECONSTRUCTION WITH PLACEMENT OF TISSUE EXPANDER AND ALLODERM Right 05/16/2019   Procedure: RIGHT BREAST RECONSTRUCTION WITH PLACEMENT OF TISSUE EXPANDER AND ALLODERM;  Surgeon: Irene Limbo, MD;  Location: [facility];  Service: Plastics;  Laterality: Right;  . CESAREAN SECTION    . HEMORRHOID SURGERY    . NIPPLE SPARING MASTECTOMY WITH SENTINEL LYMPH NODE BIOPSY Right 05/16/2019   Procedure: RIGHT NIPPLE SPARING MASTECTOMY WITH RIGHT AXILLARY SENTINEL LYMPH NODE BIOPSY RIGHT SEED GUIDED NODE EXCISION;  Surgeon: Rolm Bookbinder, MD;  Location: [facility];  Service: General;  Laterality: Right;  . TONSILLECTOMY    . TONSILLITIS      There were no vitals filed for this visit.  Subjective Assessment - 09/03/19 1059    Subjective  Pt reports she is doing well.  She has been using the Flexitouch and feels that it is helping her. She asked for preliminary measurements to send to Flexitouch so progress note from 12/7/ 2020  was given to her since it had measurements on it.  She has started taxol in her chemo and has noticed that she is having facial and abdominal swelling possilbly related to that or the steroids given with it. She will ask her doctor about that next time she sees her.    Currently in Pain?  No/denies         [facility] PT Assessment - 09/03/19 0001      Assessment   Medical Diagnosis  s/p right mastectomy and ALND    Referring Provider (PT)  Dr. Josie Dixon     Onset Date/Surgical Date  05/16/19      Prior Function   Level of Independence  Independent        LYMPHEDEMA/ONCOLOGY QUESTIONNAIRE - 09/03/19 0813      Right Upper Extremity Lymphedema   15 cm Proximal to Olecranon Process  21.5 cm    10 cm Proximal to Olecranon Process  31.3 cm    Olecranon Process  28 cm    15 cm Proximal to Ulnar Styloid Process  27.3 cm    10 cm Proximal to Ulnar Styloid Process  24 cm    Just Proximal to Ulnar Styloid Process  16 cm    Across Hand at [employer]  20.2 cm    At [place] of  2nd Digit  6.5 cm                [facility] Adult PT Treatment/Exercise - 09/03/19 0001      Exercises   Exercises  Shoulder      Shoulder Exercises: Standing   External Rotation  Strengthening;Right;5 reps;Theraband    Theraband Level (Shoulder External Rotation)  Level 1 (Yellow)    Internal Rotation  Strengthening;Right;5 reps;Theraband    Theraband Level (Shoulder Internal Rotation)  Level 1 (Yellow)    Flexion  Strengthening;Right;5 reps;Theraband    Theraband Level (Shoulder Flexion)  Level 1 (Yellow)    Extension  Strengthening;Right;5 reps;Theraband    Theraband Level (Shoulder Extension)  Level 1 (Yellow)    Row  Strengthening;Right;Left;10 reps;Theraband    Theraband Level (Shoulder Row)  Level 2 (Red)    Row Limitations  10 reps bilaterally and 10 reps unilaterally with cues to stand tall and keep core stable     Retraction  Strengthening;Right;Left;10 reps    Theraband Level (Shoulder  Retraction)  Level 1 (Yellow)    Retraction Limitations  loop around both hands at wall for retraction     Other Standing Exercises  yellow theraband loop around both hands on wall  and reach to 1 , 3, 5 oclock position with each hand       Shoulder Exercises: Pulleys   Flexion  2 minutes    ABduction  2 minutes      Manual Therapy   Compression Bandaging  cellona to right index finger and hand and coban wrap around same for compression to right index finger and thenar space.              PT Education - 09/03/19 1109    Education Details  rockwood series of shoulder strengthening    Person(s) Educated  Patient    Methods  Explanation;Demonstration;Verbal cues;Handout    Comprehension  Verbalized understanding;Returned demonstration          PT Long Term Goals - 09/03/19 1118      PT LONG TERM GOAL #1   Title  Patient will demonstrate she has regained full shoulder ROM and function post operatively compared to baselines.    Baseline  08/20/2019 Still limited in abduction in very end range    Time  8    Period  Weeks    Status  On-going      PT LONG TERM GOAL #2   Title  Patient will increase right shoulder active flexion to >/= 145 degrees to increased ease reaching.    Baseline  108 post op,, 151 on 08/20/2019    Status  Achieved      PT LONG TERM GOAL #3   Title  Patient will increase right shoulder active abduction to >/= 145 degrees to increased ease reaching and obtaining radiation positioning.    Baseline  108 degrees post op,, 145 on 08/20/2019    Status  Achieved      PT LONG TERM GOAL #4   Title  Patient will improve DASH score to be </= 10 for increased ease with all upper extremity function.    Baseline  36.36 at baseline    Time  8    Period  Weeks    Status  On-going      PT LONG TERM GOAL #5   Title  Patient will verbalize risk reduction practices for lymphedema.    Time  8    Period  Weeks    Status  On-going      Additional Long Term Goals    Additional Long Term Goals  Yes      PT LONG TERM GOAL #6   Title  Pt will be independent in a strengthening HEP program    Time  8    Period  Weeks    Status  New      PT LONG TERM GOAL #7   Title  Pt will be independent in managing right arm lymphedema at home with self MLD, Flexitouch, use of compression and exercise    Time  8    Period  Weeks    Status  New            Plan - 09/03/19 1111    Clinical Impression Statement  Pt is doing well overall.  She feels that she swelling is being managed with Flexitouch and exercise.  Daytime compression garments have been ordered. She continues with chemotherapy and has noticed swelling in her face and abdomen.  Her ROM is improved and she is continuing to upgrade her strengthening. She is now coming one time a week, but plan to decrease that as she learns more strengthening.  Recert sent today for 8 more weeks to allow for time for garments to arrive and check fit and assure that pt is comfortable managing care at home .    Personal Factors and Comorbidities  Comorbidity 3+    Comorbidities  surgery, ongoing chemo, lymphedema    Examination-Activity Limitations  Other    Stability/Clinical Decision Making  Stable/Uncomplicated    Clinical Decision Making  Low    Rehab Potential  Excellent    PT Frequency  2x / week   weaning frequency   PT Duration  8 weeks    PT Treatment/Interventions  ADLs/Self Care Home Management;Therapeutic exercise;Patient/family education;Passive range of motion;Manual techniques;Manual lymph drainage;Scar mobilization;Compression bandaging;Orthotic Fit/Training    PT Next Visit Plan  progress  right shoulder exercise as needed, teach Strength ABC  contManual lymph drainage to right arm and compression bandaging . Get her signed up for ABC class.    PT Home Exercise Plan  Post op shoulder ROM HEP and supine cane abduction, supine scapular series, [place] series    Consulted and Agree with Plan of Care   Patient       Patient will benefit from skilled therapeutic intervention in order to improve the following deficits and impairments:  Postural dysfunction, Decreased range of motion, Increased fascial restricitons, Impaired UE functional use, Pain, Decreased knowledge of precautions, Decreased scar mobility, Increased edema  Visit Diagnosis: Abnormal posture - Plan: PT plan of care cert/re-cert  Stiffness of right shoulder, not elsewhere classified - Plan: PT plan of care cert/re-cert  Aftercare following surgery for neoplasm - Plan: PT plan of care cert/re-cert  Postmastectomy lymphedema - Plan: PT plan of care cert/re-cert     Problem List Patient Active Problem List   Diagnosis Date Noted  . S/P mastectomy, right 05/16/2019  . Genetic testing 04/02/2019  . Family history of uterine cancer   . Malignant neoplasm of upper-outer quadrant of right breast in female, estrogen receptor positive ([place]) 03/25/2019   Donato Heinz. Owens Shark PT  Norwood Levo 09/03/2019, 11:27 AM  [place], [place], [address] Phone: [phone]   Fax:  [phone]  Name: Krystal Davis MRN: [id number] Date of Birth: 1971-03-09

## 2019-09-08 ENCOUNTER — Other Ambulatory Visit: Payer: Self-pay

## 2019-09-08 ENCOUNTER — Encounter: Payer: Self-pay | Admitting: Physical Therapy

## 2019-09-08 ENCOUNTER — Ambulatory Visit: Payer: BC Managed Care – PPO | Admitting: Physical Therapy

## 2019-09-08 DIAGNOSIS — M25611 Stiffness of right shoulder, not elsewhere classified: Secondary | ICD-10-CM

## 2019-09-08 DIAGNOSIS — I972 Postmastectomy lymphedema syndrome: Secondary | ICD-10-CM

## 2019-09-08 DIAGNOSIS — Z483 Aftercare following surgery for neoplasm: Secondary | ICD-10-CM

## 2019-09-08 DIAGNOSIS — R293 Abnormal posture: Secondary | ICD-10-CM

## 2019-09-08 NOTE — Therapy (Signed)
[place], [place], [address] Phone: [phone]   Fax:  [phone]  Physical Therapy Treatment  Patient Details  Name: Krystal Davis MRN: [id number] Date of Birth: [birth year] Referring Provider (PT): Dr. Josie Dixon   Progress Note Reporting Period 07/31/2019 to 09/08/2019  See note below for Objective Data and Assessment of Progress/Goals.      Encounter Date: 09/08/2019  PT End of Session - 09/08/19 1247    Visit Number  10    Number of Visits  18    PT Start Time  0910    PT Stop Time  0955    PT Time Calculation (min)  45 min    Activity Tolerance  Patient tolerated treatment well    Behavior During Therapy  [facility] for tasks assessed/performed       Past Medical History:  Diagnosis Date  . Abnormal Pap smear of vagina   . Cancer [facility])    Breast Cancer  . Family history of uterine cancer   . Low back pain   . Low HDL (under 40)   . Pre-diabetes   . Rectal bleeding     Past Surgical History:  Procedure Laterality Date  . BREAST RECONSTRUCTION WITH PLACEMENT OF TISSUE EXPANDER AND ALLODERM Right 05/16/2019   Procedure: RIGHT BREAST RECONSTRUCTION WITH PLACEMENT OF TISSUE EXPANDER AND ALLODERM;  Surgeon: Irene Limbo, MD;  Location: [place];  Service: Plastics;  Laterality: Right;  . CESAREAN SECTION    . HEMORRHOID SURGERY    . NIPPLE SPARING MASTECTOMY WITH SENTINEL LYMPH NODE BIOPSY Right 05/16/2019   Procedure: RIGHT NIPPLE SPARING MASTECTOMY WITH RIGHT AXILLARY SENTINEL LYMPH NODE BIOPSY RIGHT SEED GUIDED NODE EXCISION;  Surgeon: Rolm Bookbinder, MD;  Location: [place];  Service: General;  Laterality: Right;  . TONSILLECTOMY    . TONSILLITIS      There were no vitals filed for this visit.  Subjective Assessment - 09/08/19 1244    Subjective  Pt reports she is doing well.  She found the coban wrap on her finger helped.  She is using Flexitouch every other day and it is  helping.  Her face appears to be less swollen today    Pertinent History  Patient was diagnosed on 03/14/2019 with right grade I invasive ductal carcinoma breast cancer. It is ER/PR positive and HER2 negative with a Ki67 of 40%. Patient underwent a right nipple sparing mastectomy and sentinel node biopsy (3/3 nodes positive) with an expander placed on 05/16/2019. She then went to [facility] for further surgery and had 19 more nodes removed and found 2 to be positive. She also had her nipple removed due to finding cancer in it.    Currently in Pain?  No/denies                       [facility] Adult PT Treatment/Exercise - 09/08/19 0001      Exercises   Exercises  Other Exercises    Other Exercises   Instructed in all aspects of Strength ABC program.  Pt has been an exerciser in the past so she is familiar with the exercises.  Modified quaduped exercise to doing in standing with a chair and substituted clams for sit ups. Pt feels comfortable with all exercises and how to progress resistence       Manual Therapy   Compression Bandaging  cellona to right index finger and hand and coban wrap around same for compression  to right index finger and thenar space.              PT Education - 09/08/19 1247    Education Details  Strength ABC program    Person(s) Educated  Patient    Methods  Explanation;Demonstration;Handout    Comprehension  Verbalized understanding;Returned demonstration          PT Long Term Goals - 09/03/19 1118      PT LONG TERM GOAL #1   Title  Patient will demonstrate she has regained full shoulder ROM and function post operatively compared to baselines.    Baseline  08/20/2019 Still limited in abduction in very end range    Time  8    Period  Weeks    Status  On-going      PT LONG TERM GOAL #2   Title  Patient will increase right shoulder active flexion to >/= 145 degrees to increased ease reaching.    Baseline  108 post op,, 151 on 08/20/2019     Status  Achieved      PT LONG TERM GOAL #3   Title  Patient will increase right shoulder active abduction to >/= 145 degrees to increased ease reaching and obtaining radiation positioning.    Baseline  108 degrees post op,, 145 on 08/20/2019    Status  Achieved      PT LONG TERM GOAL #4   Title  Patient will improve DASH score to be </= 10 for increased ease with all upper extremity function.    Baseline  36.36 at baseline    Time  8    Period  Weeks    Status  On-going      PT LONG TERM GOAL #5   Title  Patient will verbalize risk reduction practices for lymphedema.    Time  8    Period  Weeks    Status  On-going      Additional Long Term Goals   Additional Long Term Goals  Yes      PT LONG TERM GOAL #6   Title  Pt will be independent in a strengthening HEP program    Time  8    Period  Weeks    Status  New      PT LONG TERM GOAL #7   Title  Pt will be independent in managing right arm lymphedema at home with self MLD, Flexitouch, use of compression and exercise    Time  8    Period  Weeks    Status  New            Plan - 09/08/19 1248    Clinical Impression Statement  Pt continues to progress and is demonstrating that she can manage her lymphedema at home.  Upgraded strengthening program today.  She will be ready to discharge soon    Comorbidities  surgery, ongoing chemo, lymphedema    Stability/Clinical Decision Making  Stable/Uncomplicated    Rehab Potential  Excellent    PT Frequency  1x / week    PT Duration  8 weeks    PT Treatment/Interventions  ADLs/Self Care Home Management;Therapeutic exercise;Patient/family education;Passive range of motion;Manual techniques;Manual lymph drainage;Scar mobilization;Compression bandaging;Orthotic Fit/Training    PT Next Visit Plan  review Strength ABC as needed  contManual lymph drainage to right arm and compression bandaging as needed . Get her signed up for ABC class.prepare for discharge    PT Home Exercise Plan  Post  op shoulder ROM HEP and  supine cane abduction, supine scapular series, [place] series, Strength ABC       Patient will benefit from skilled therapeutic intervention in order to improve the following deficits and impairments:  Postural dysfunction, Decreased range of motion, Increased fascial restricitons, Impaired UE functional use, Pain, Decreased knowledge of precautions, Decreased scar mobility, Increased edema  Visit Diagnosis: Abnormal posture  Stiffness of right shoulder, not elsewhere classified  Aftercare following surgery for neoplasm  Postmastectomy lymphedema     Problem List Patient Active Problem List   Diagnosis Date Noted  . S/P mastectomy, right 05/16/2019  . Genetic testing 04/02/2019  . Family history of uterine cancer   . Malignant neoplasm of upper-outer quadrant of right breast in female, estrogen receptor positive ([place]) 03/25/2019   Donato Heinz. Owens Shark PT  Norwood Levo 09/08/2019, 12:51 PM  [place], [place], [address] Phone: [phone]   Fax:  [phone]  Name: Anylah Scheib MRN: [id number] Date of Birth: [birth year]

## 2019-09-15 ENCOUNTER — Ambulatory Visit: Payer: BC Managed Care – PPO | Admitting: Physical Therapy

## 2019-09-15 ENCOUNTER — Other Ambulatory Visit: Payer: Self-pay

## 2019-09-15 ENCOUNTER — Encounter: Payer: Self-pay | Admitting: Physical Therapy

## 2019-09-15 DIAGNOSIS — I972 Postmastectomy lymphedema syndrome: Secondary | ICD-10-CM

## 2019-09-15 DIAGNOSIS — R293 Abnormal posture: Secondary | ICD-10-CM | POA: Diagnosis not present

## 2019-09-15 DIAGNOSIS — Z483 Aftercare following surgery for neoplasm: Secondary | ICD-10-CM

## 2019-09-15 DIAGNOSIS — M25611 Stiffness of right shoulder, not elsewhere classified: Secondary | ICD-10-CM

## 2019-09-15 NOTE — Therapy (Signed)
[place], [place], [address] Phone: [phone]   Fax:  [phone]  Physical Therapy Treatment  Patient Details  Name: Krystal Davis MRN: [id number] Date of Birth: July 19, 1971 Referring Provider (PT): Dr. Josie Dixon    Encounter Date: 09/15/2019  PT End of Session - 09/15/19 1501    Visit Number  11    Number of Visits  18    Date for PT Re-Evaluation  10/24/19    PT Start Time  0900    PT Stop Time  0950    PT Time Calculation (min)  50 min    Activity Tolerance  Patient tolerated treatment well    Behavior During Therapy  [facility] for tasks assessed/performed       Past Medical History:  Diagnosis Date  . Abnormal Pap smear of vagina   . Cancer [facility])    Breast Cancer  . Family history of uterine cancer   . Low back pain   . Low HDL (under 40)   . Pre-diabetes   . Rectal bleeding     Past Surgical History:  Procedure Laterality Date  . BREAST RECONSTRUCTION WITH PLACEMENT OF TISSUE EXPANDER AND ALLODERM Right 05/16/2019   Procedure: RIGHT BREAST RECONSTRUCTION WITH PLACEMENT OF TISSUE EXPANDER AND ALLODERM;  Surgeon: Irene Limbo, MD;  Location: [place];  Service: Plastics;  Laterality: Right;  . CESAREAN SECTION    . HEMORRHOID SURGERY    . NIPPLE SPARING MASTECTOMY WITH SENTINEL LYMPH NODE BIOPSY Right 05/16/2019   Procedure: RIGHT NIPPLE SPARING MASTECTOMY WITH RIGHT AXILLARY SENTINEL LYMPH NODE BIOPSY RIGHT SEED GUIDED NODE EXCISION;  Surgeon: Rolm Bookbinder, MD;  Location: [place];  Service: General;  Laterality: Right;  . TONSILLECTOMY    . TONSILLITIS      There were no vitals filed for this visit.  Subjective Assessment - 09/15/19 0953    Subjective  pt states that the past couple of days she developed a pain in her right side just below the axilla that is present when she is upright, but goes away when lying down  She as the pain all the time when she is upright. She is  not having any trouble breathing.  She used the Flexitouch pump and it helps with the hand but doens't help with the pain She has received 2 flat knit sleeves and gloves and brings them in today for fit. She will not be getting a nighttime garments    Pertinent History  Patient was diagnosed on 03/14/2019 with right grade I invasive ductal carcinoma breast cancer. It is ER/PR positive and HER2 negative with a Ki67 of 40%. Patient underwent a right nipple sparing mastectomy and sentinel node biopsy (3/3 nodes positive) with an expander placed on 05/16/2019. She then went to [facility] for further surgery and had 19 more nodes removed and found 2 to be positive. She also had her nipple removed due to finding cancer in it.    Patient Stated Goals  Get my arm moving better and check on my swelling         [facility] PT Assessment - 09/15/19 0001      Observation/Other Assessments   Quick DASH   20.45      AROM   Right Shoulder Flexion  170 Degrees    Right Shoulder Internal Rotation  150 Degrees        LYMPHEDEMA/ONCOLOGY QUESTIONNAIRE - 09/15/19 0915      Right Upper Extremity Lymphedema  15 cm Proximal to Olecranon Process  32 cm    10 cm Proximal to Olecranon Process  31.5 cm    Olecranon Process  27.5 cm    15 cm Proximal to Ulnar Styloid Process  27 cm    10 cm Proximal to Ulnar Styloid Process  24 cm    Just Proximal to Ulnar Styloid Process  15.6 cm    Across Hand at [employer]  19.5 cm    At [place] of 2nd Digit  6.3 cm        Quick Dash - 09/15/19 0001    Open a tight or new jar  Mild difficulty    Do heavy household chores (wash walls, wash floors)  Mild difficulty    Carry a shopping bag or briefcase  No difficulty    Wash your back  Mild difficulty    Use a knife to cut food  No difficulty    Recreational activities in which you take some force or impact through your arm, shoulder, or hand (golf, hammering, tennis)  Moderate difficulty    During the past week, to what  extent has your arm, shoulder or hand problem interfered with your normal social activities with family, friends, neighbors, or groups?  Slightly    During the past week, to what extent has your arm, shoulder or hand problem limited your work or other regular daily activities  Slightly    Arm, shoulder, or hand pain.  Mild    Tingling (pins and needles) in your arm, shoulder, or hand  None    Difficulty Sleeping  Mild difficulty    DASH Score  20.45 %             [facility] Adult PT Treatment/Exercise - 09/15/19 0001      Exercises   Exercises  Other Exercises    Other Exercises   Reviewd UE stretching exercies of Strength ABC program and pt states she understands now       Manual Therapy   Manual therapy comments  instructed pt in application of compression sleeve and glove manually and with Medi butler which she will consider getting if she things she needs it.  Sleeve appears to fit well and instructed pt to make sure she wears it when she is doing resisted exerices and heavy house work.  She thinks her glove fingers are a little tight and she will wear her glove more consistently to see if it will help decrease the lymphedema in her right index finger     Edema Management  lymphedema appears to be under control today with only area of swelling in right index finger.  Remeasuared arm              PT Education - 09/15/19 1500    Education Details  how to apply compression sleeve, gave information about ABC class    Person(s) Educated  Patient    Methods  Explanation;Demonstration;Verbal cues;Handout;Tactile cues    Comprehension  Verbalized understanding;Returned demonstration          PT Long Term Goals - 09/15/19 0943      PT LONG TERM GOAL #1   Title  Patient will demonstrate she has regained full shoulder ROM and function post operatively compared to baselines.    Baseline  08/20/2019 Still limited in abduction in very end range, 09/15/2019, still limited in end range  of abduction    Status  On-going      PT LONG TERM  GOAL #2   Title  Patient will increase right shoulder active flexion to >/= 145 degrees to increased ease reaching.    Baseline  108 post op,, 151 on 08/20/2019, 170 on 09/15/2019    Status  Achieved      PT LONG TERM GOAL #3   Title  Patient will increase right shoulder active abduction to >/= 145 degrees to increased ease reaching and obtaining radiation positioning.    Baseline  108 degrees post op,, 145 on 08/20/2019, 150 on 09/15/2019    Status  Achieved      PT LONG TERM GOAL #4   Title  Patient will improve DASH score to be </= 10 for increased ease with all upper extremity function.    Baseline  36.36 at baseline,, 20.45 on 09/15/2019      PT LONG TERM GOAL #5   Title  Patient will verbalize risk reduction practices for lymphedema.    Baseline  pt signed up for ABC class    Time  8    Period  Weeks    Status  On-going      PT LONG TERM GOAL #6   Title  Pt will be independent in a strengthening HEP program    Time  8    Period  Weeks    Status  Achieved      PT LONG TERM GOAL #7   Title  Pt will be independent in managing right arm lymphedema at home with self MLD, Flexitouch, use of compression and exercise    Status  Achieved            Plan - 09/15/19 1501    Clinical Impression Statement  Pt is doing very well and is exercising and managing her lymphedema at home. She has compression sleeves and Flexitouch .She still has decrease in end range of shoulder abduction. She wants to work on that at home for about a month and return for a check up prior to radiation. She will discuss new pain in lateral chest with her MD    Personal Factors and Comorbidities  Comorbidity 3+    Comorbidities  surgery, ongoing chemo, lymphedema    Stability/Clinical Decision Making  Stable/Uncomplicated    Rehab Potential  Excellent    PT Duration  8 weeks    PT Treatment/Interventions  ADLs/Self Care Home Management;Therapeutic  exercise;Patient/family education;Passive range of motion;Manual techniques;Manual lymph drainage;Scar mobilization;Compression bandaging;Orthotic Fit/Training    PT Next Visit Plan  remeasure. Progress with right shoulder abduction stretching if pt has not been able to see gains on her own    Consulted and Agree with Plan of Care  Patient       Patient will benefit from skilled therapeutic intervention in order to improve the following deficits and impairments:  Postural dysfunction, Decreased range of motion, Increased fascial restricitons, Impaired UE functional use, Pain, Decreased knowledge of precautions, Decreased scar mobility, Increased edema  Visit Diagnosis: Abnormal posture  Stiffness of right shoulder, not elsewhere classified  Postmastectomy lymphedema  Aftercare following surgery for neoplasm     Problem List Patient Active Problem List   Diagnosis Date Noted  . S/P mastectomy, right 05/16/2019  . Genetic testing 04/02/2019  . Family history of uterine cancer   . Malignant neoplasm of upper-outer quadrant of right breast in female, estrogen receptor positive ([place]) 03/25/2019   Donato Heinz. Owens Shark, PT  Norwood Levo 09/15/2019, 3:05 PM  [place],  [place], [address] Phone: [phone]   Fax:  [phone]  Name: Arrin Ishler MRN: [id number] Date of Birth: 1971/03/23

## 2019-09-22 ENCOUNTER — Encounter: Payer: BC Managed Care – PPO | Admitting: Physical Therapy

## 2019-10-12 ENCOUNTER — Ambulatory Visit: Payer: BC Managed Care – PPO | Attending: Internal Medicine

## 2019-10-12 DIAGNOSIS — Z23 Encounter for immunization: Secondary | ICD-10-CM | POA: Insufficient documentation

## 2019-10-12 NOTE — Progress Notes (Signed)
   Covid-19 Vaccination Clinic  Name:  Krystal Davis    MRN: [id number] DOB: [birth year]  10/12/2019  Krystal Davis was observed post Covid-19 immunization for 15 minutes without incidence. She was provided with Vaccine Information Sheet and instruction to access the V-Safe system.   Krystal Davis was instructed to call 911 with any severe reactions post vaccine: Marland Kitchen Difficulty breathing  . Swelling of your face and throat  . A fast heartbeat  . A bad rash all over your body  . Dizziness and weakness    Immunizations Administered    Name Date Dose VIS Date Route   Pfizer COVID-19 Vaccine 10/12/2019 10:34 AM 0.3 mL 08/08/2019 Intramuscular   Manufacturer: [place]   Lot: [id number]   [place]: [id number]

## 2019-10-13 ENCOUNTER — Ambulatory Visit: Payer: BC Managed Care – PPO | Attending: Hematology & Oncology | Admitting: Physical Therapy

## 2019-11-03 ENCOUNTER — Ambulatory Visit: Payer: BC Managed Care – PPO | Attending: Internal Medicine

## 2019-11-03 DIAGNOSIS — Z23 Encounter for immunization: Secondary | ICD-10-CM

## 2019-11-03 NOTE — Progress Notes (Signed)
   Covid-19 Vaccination Clinic  Name:  Krystal Davis    MRN: [id number] DOB: 04-14-1971  11/03/2019  Ms. Chaban was observed post Covid-19 immunization for 15 minutes without incident. She was provided with Vaccine Information Sheet and instruction to access the V-Safe system.   Ms. Ventrice was instructed to call 911 with any severe reactions post vaccine: Marland Kitchen Difficulty breathing  . Swelling of face and throat  . A fast heartbeat  . A bad rash all over body  . Dizziness and weakness   Immunizations Administered    Name Date Dose VIS Date Route   Pfizer COVID-19 Vaccine 11/03/2019  1:40 PM 0.3 mL 08/08/2019 Intramuscular   Manufacturer: [place]   Lot: [id number]   [place]: [id number]

## 2019-11-06 ENCOUNTER — Other Ambulatory Visit: Payer: Self-pay

## 2019-11-06 ENCOUNTER — Encounter: Payer: Self-pay | Admitting: Physical Therapy

## 2019-11-06 ENCOUNTER — Ambulatory Visit: Payer: BC Managed Care – PPO | Attending: Hematology & Oncology | Admitting: Physical Therapy

## 2019-11-06 DIAGNOSIS — M25611 Stiffness of right shoulder, not elsewhere classified: Secondary | ICD-10-CM | POA: Diagnosis present

## 2019-11-06 DIAGNOSIS — Z483 Aftercare following surgery for neoplasm: Secondary | ICD-10-CM | POA: Insufficient documentation

## 2019-11-06 DIAGNOSIS — R293 Abnormal posture: Secondary | ICD-10-CM

## 2019-11-06 DIAGNOSIS — I972 Postmastectomy lymphedema syndrome: Secondary | ICD-10-CM | POA: Insufficient documentation

## 2019-11-06 NOTE — Therapy (Addendum)
[place], [place], [address] Phone: [phone]   Fax:  [phone]  Physical Therapy Treatment  Patient Details  Name: Krystal Davis MRN: [id number] Date of Birth: 1971/06/19 Referring Provider (PT): Dr. Josie Dixon    Encounter Date: 11/06/2019  PT End of Session - 11/06/19 1722    Visit Number  12    Number of Visits  18    Date for PT Re-Evaluation  02/06/20    PT Start Time  1400    PT Stop Time  1445    PT Time Calculation (min)  45 min    Activity Tolerance  Patient tolerated treatment well    Behavior During Therapy  [facility] for tasks assessed/performed       Past Medical History:  Diagnosis Date  . Abnormal Pap smear of vagina   . Cancer [facility])    Breast Cancer  . Family history of uterine cancer   . Low back pain   . Low HDL (under 40)   . Pre-diabetes   . Rectal bleeding     Past Surgical History:  Procedure Laterality Date  . BREAST RECONSTRUCTION WITH PLACEMENT OF TISSUE EXPANDER AND ALLODERM Right 05/16/2019   Procedure: RIGHT BREAST RECONSTRUCTION WITH PLACEMENT OF TISSUE EXPANDER AND ALLODERM;  Surgeon: Irene Limbo, MD;  Location: [place];  Service: Plastics;  Laterality: Right;  . CESAREAN SECTION    . HEMORRHOID SURGERY    . NIPPLE SPARING MASTECTOMY WITH SENTINEL LYMPH NODE BIOPSY Right 05/16/2019   Procedure: RIGHT NIPPLE SPARING MASTECTOMY WITH RIGHT AXILLARY SENTINEL LYMPH NODE BIOPSY RIGHT SEED GUIDED NODE EXCISION;  Surgeon: Rolm Bookbinder, MD;  Location: [place];  Service: General;  Laterality: Right;  . TONSILLECTOMY    . TONSILLITIS      There were no vitals filed for this visit.  Subjective Assessment - 11/06/19 1402    Subjective  Pt states she completed her chemotherapy this week.  She plans to begin radiation soon. She sitll has expanders in .She will have a hysteretomy and then will have reconstructive breast surgery    Pertinent History  Patient was  diagnosed on 03/14/2019 with right grade I invasive ductal carcinoma breast cancer. It is ER/PR positive and HER2 negative with a Ki67 of 40%. Patient underwent a right nipple sparing mastectomy and sentinel node biopsy (3/3 nodes positive) with an expander placed on 05/16/2019. She then went to [facility] for further surgery and had 19 more nodes removed and found 2 to be positive. She also had her nipple removed due to finding cancer in it.    Patient Stated Goals  Get my arm moving better and check on my swelling    Currently in Pain?  No/denies         [facility] PT Assessment - 11/06/19 0001      Assessment   Medical Diagnosis  s/p right mastectomy and ALND    Referring Provider (PT)  Dr. Josie Dixon     Onset Date/Surgical Date  05/16/19      Observation/Other Assessments   Observations  L-Dex score 1.4      AROM   Right Shoulder Flexion  170 Degrees    Right Shoulder Internal Rotation  165 Degrees      Palpation   Palpation comment  2 guitar string cords in right axilla that pt says come and gol         LYMPHEDEMA/ONCOLOGY QUESTIONNAIRE - 11/06/19 1414  Right Upper Extremity Lymphedema   15 cm Proximal to Olecranon Process  33 cm    10 cm Proximal to Olecranon Process  32.8 cm    Olecranon Process  28.5 cm    15 cm Proximal to Ulnar Styloid Process  28 cm    10 cm Proximal to Ulnar Styloid Process  24.5 cm    Just Proximal to Ulnar Styloid Process  15.7 cm    Across Hand at [employer]  19.7 cm    At [place] of 2nd Digit  6.2 cm      Left Upper Extremity Lymphedema   15 cm Proximal to Olecranon Process  34 cm    10 cm Proximal to Olecranon Process  33.6 cm    Olecranon Process  28.5 cm    15 cm Proximal to Ulnar Styloid Process  27 cm    10 cm Proximal to Ulnar Styloid Process  23.5 cm    Just Proximal to Ulnar Styloid Process  15.4 cm    Across Hand at [employer]  19.4 cm    At [place] of 2nd Digit  6 cm                [facility] Adult PT  Treatment/Exercise - 11/06/19 0001      Exercises   Exercises  Shoulder      Shoulder Exercises: Stretch   Other Shoulder Stretches  reviewed wall stretches  with pt using left hand to apply counter stretch at axilla to stretch cord.  encouraged pt to continue stretching throughout radiation     Other Shoulder Stretches  doorway stretch                   PT Long Term Goals - 11/06/19 1728      PT LONG TERM GOAL #1   Title  Patient will demonstrate she has regained full shoulder ROM and function post operatively compared to baselines.    Status  Achieved      PT LONG TERM GOAL #2   Title  Patient will increase right shoulder active flexion to >/= 145 degrees to increased ease reaching.    Status  Achieved      PT LONG TERM GOAL #3   Title  Patient will increase right shoulder active abduction to >/= 145 degrees to increased ease reaching and obtaining radiation positioning.    Status  Achieved      PT LONG TERM GOAL #4   Title  Patient will improve DASH score to be </= 10 for increased ease with all upper extremity function.    Baseline  36.36 at baseline,, 20.45 on 09/15/2019    Status  On-going      PT LONG TERM GOAL #5   Title  Patient will verbalize risk reduction practices for lymphedema.    Status  Achieved      PT LONG TERM GOAL #6   Title  Pt will be independent in a strengthening HEP program    Status  Achieved      PT LONG TERM GOAL #7   Title  Pt will be independent in managing right arm lymphedema at home with self MLD, Flexitouch, use of compression and exercise    Status  Achieved          The patient was assessed using the L-Dex machine today to produce a lymphedema index baseline score. The patient will be reassessed on a regular basis (typically every 3 months) to  obtain new L-Dex scores. If the score is > 6.5 points away from his/her baseline score indicating onset of subclinical lymphedema, it will be recommended to wear a compression garment  for 4 weeks, 12 hours per day and then be reassessed. If the score continues to be > 6.5 points from baseline at reassessment, we will initiate lymphedema treatment. Assessing in this manner has a 95% rate of preventing clinically significant lymphedema.   Plan - 11/06/19 1723    Clinical Impression Statement  Pt is doing very well with self care and managment of right arm lymphedema at home .She has had increase in circumference of both arms likely due to weight gain since last visit. No regular PT visits are needed at this time.   She is using her Flexitouch and comrpession sleeve intermently and doing yoga, stretching and exericse to manage her symptoms. Baseline measurents of  lymphedema index (L-Dex) score in within normal range today. She would like to come back for recheck once radiation is complete so recertification period is prolonged to refect that.  Pt will call for appointment when she is ready    Personal Factors and Comorbidities  Comorbidity 3+    Comorbidities  surgery, ongoing chemo, lymphedema    Stability/Clinical Decision Making  Stable/Uncomplicated    Rehab Potential  Excellent    PT Treatment/Interventions  ADLs/Self Care Home Management;Therapeutic exercise;Patient/family education;Passive range of motion;Manual techniques;Manual lymph drainage;Scar mobilization;Compression bandaging;Orthotic Fit/Training    PT Next Visit Plan  Reassess and proceed as indicated.  Repeat L-Dex every 3 months for monitoring.    PT Home Exercise Plan  Post op shoulder ROM HEP and supine cane abduction, supine scapular series, [place] series, Strength ABC    Consulted and Agree with Plan of Care  Patient       Patient will benefit from skilled therapeutic intervention in order to improve the following deficits and impairments:  Postural dysfunction, Decreased range of motion, Increased fascial restricitons, Impaired UE functional use, Pain, Decreased knowledge of precautions, Decreased scar  mobility, Increased edema  Visit Diagnosis: Abnormal posture - Plan: PT plan of care cert/re-cert  Stiffness of right shoulder, not elsewhere classified - Plan: PT plan of care cert/re-cert  Postmastectomy lymphedema - Plan: PT plan of care cert/re-cert  Aftercare following surgery for neoplasm - Plan: PT plan of care cert/re-cert     Problem List Patient Active Problem List   Diagnosis Date Noted  . S/P mastectomy, right 05/16/2019  . Genetic testing 04/02/2019  . Family history of uterine cancer   . Malignant neoplasm of upper-outer quadrant of right breast in female, estrogen receptor positive ([facility]) 03/25/2019   Donato Heinz. Owens Shark, PT  [place]o 11/06/2019, 5:32 PM  [place], [place], [address] Phone: [phone]   Fax:  [phone]  Name: Krystal Davis MRN: [id number] Date of Birth: 1970-09-22  PHYSICAL THERAPY DISCHARGE SUMMARY  Visits from Start of Care: 12  Current functional level related to goals / functional outcomes: unknown   Remaining deficits: unknown   Education / Equipment: Home exercise, lymphedema management  Plan: Patient agrees to discharge.  Patient goals were partially met. Patient is being discharged due to not returning since the last visit.  ?????    Donato Heinz. Owens Shark, PT

## 2019-11-21 IMAGING — MG MM CLIP PLACEMENT
4 series · 4 of 12 positions shown · non-contrast
Comparison: Previous exam(s).

CLINICAL DATA: Post MRI guided core needle biopsy of left breast
upper outer quadrant 6 mm enhancing mass.

EXAM:
DIAGNOSTIC LEFT MAMMOGRAM POST MRI BIOPSY

[L CC synth-2D]
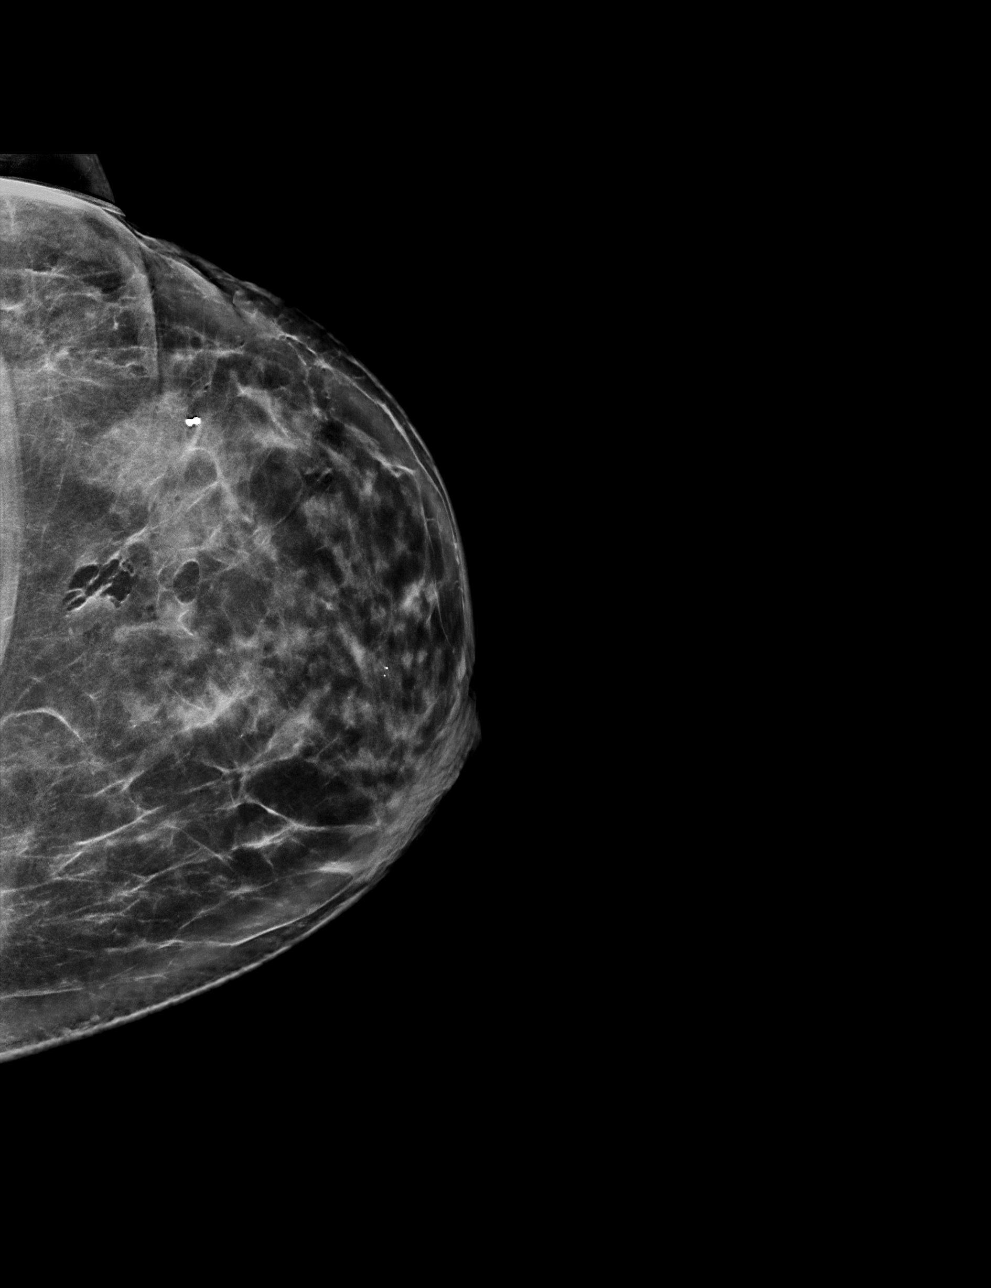

[L ML synth-2D]
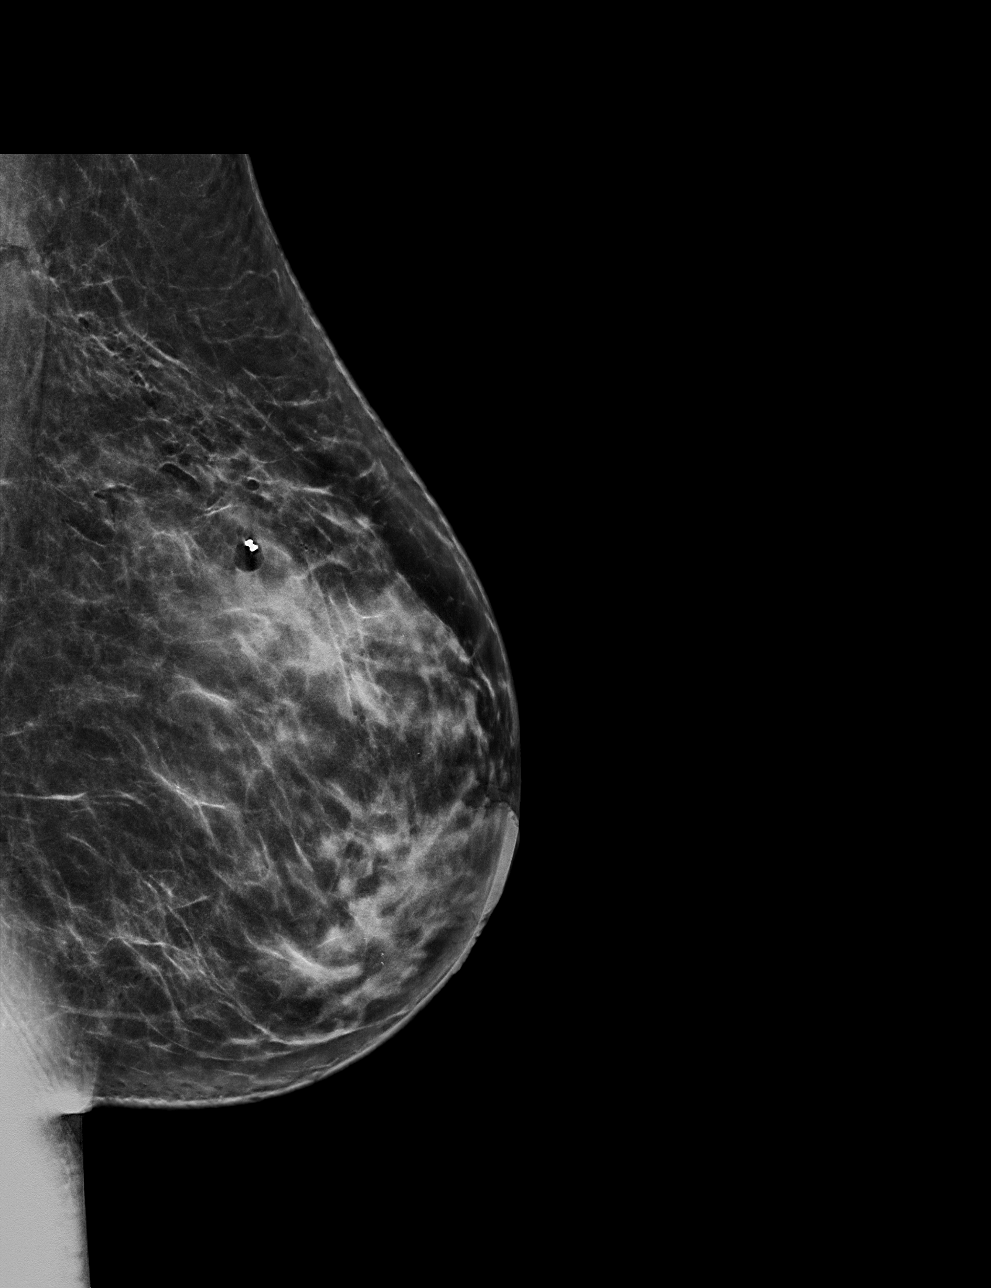

[L CC tomo · tomo slice 41/82.0]
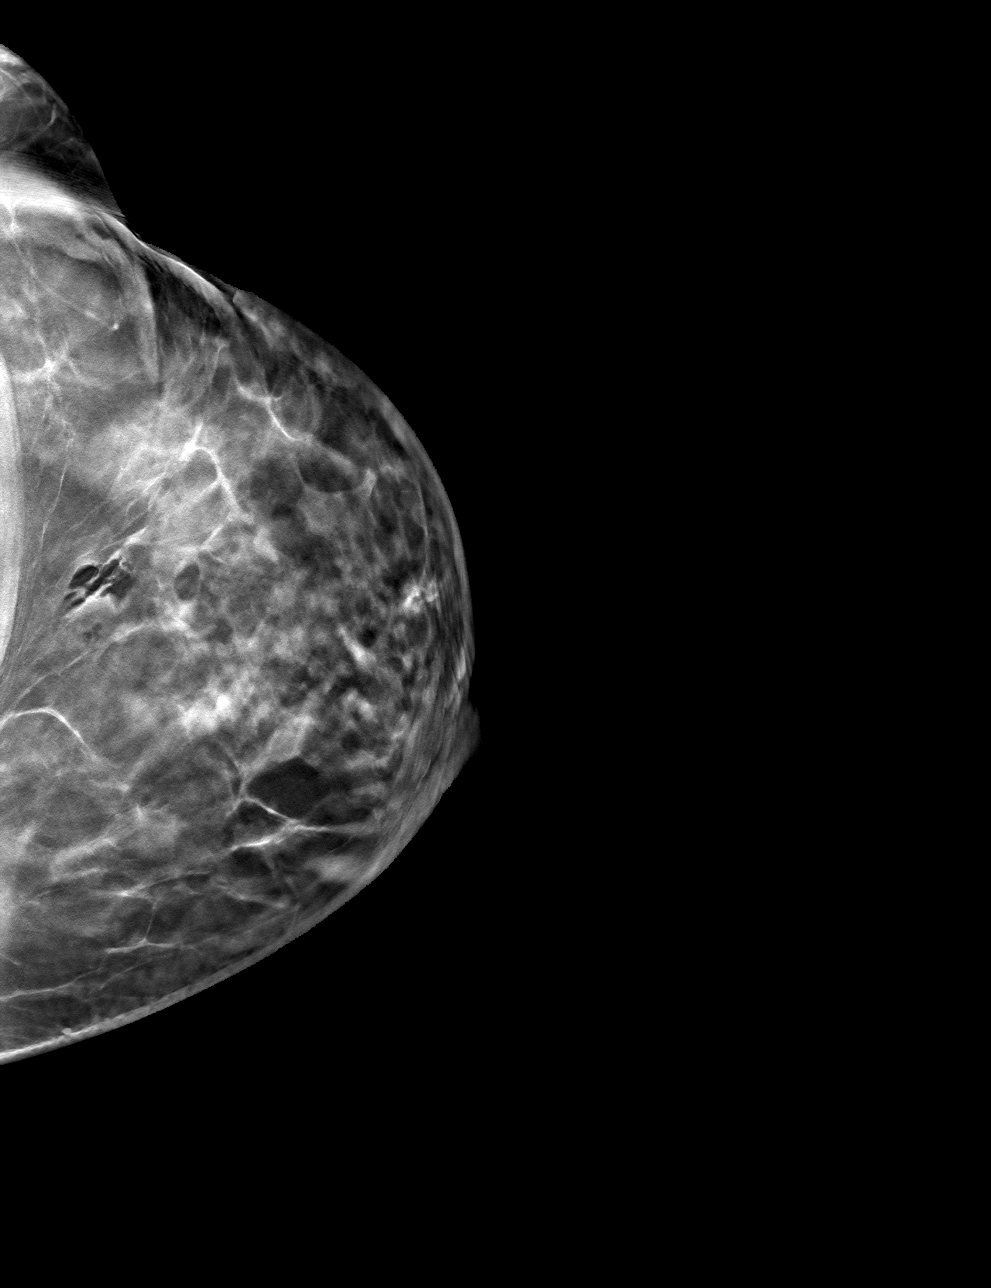

[L ML tomo · tomo slice 33/66.0]
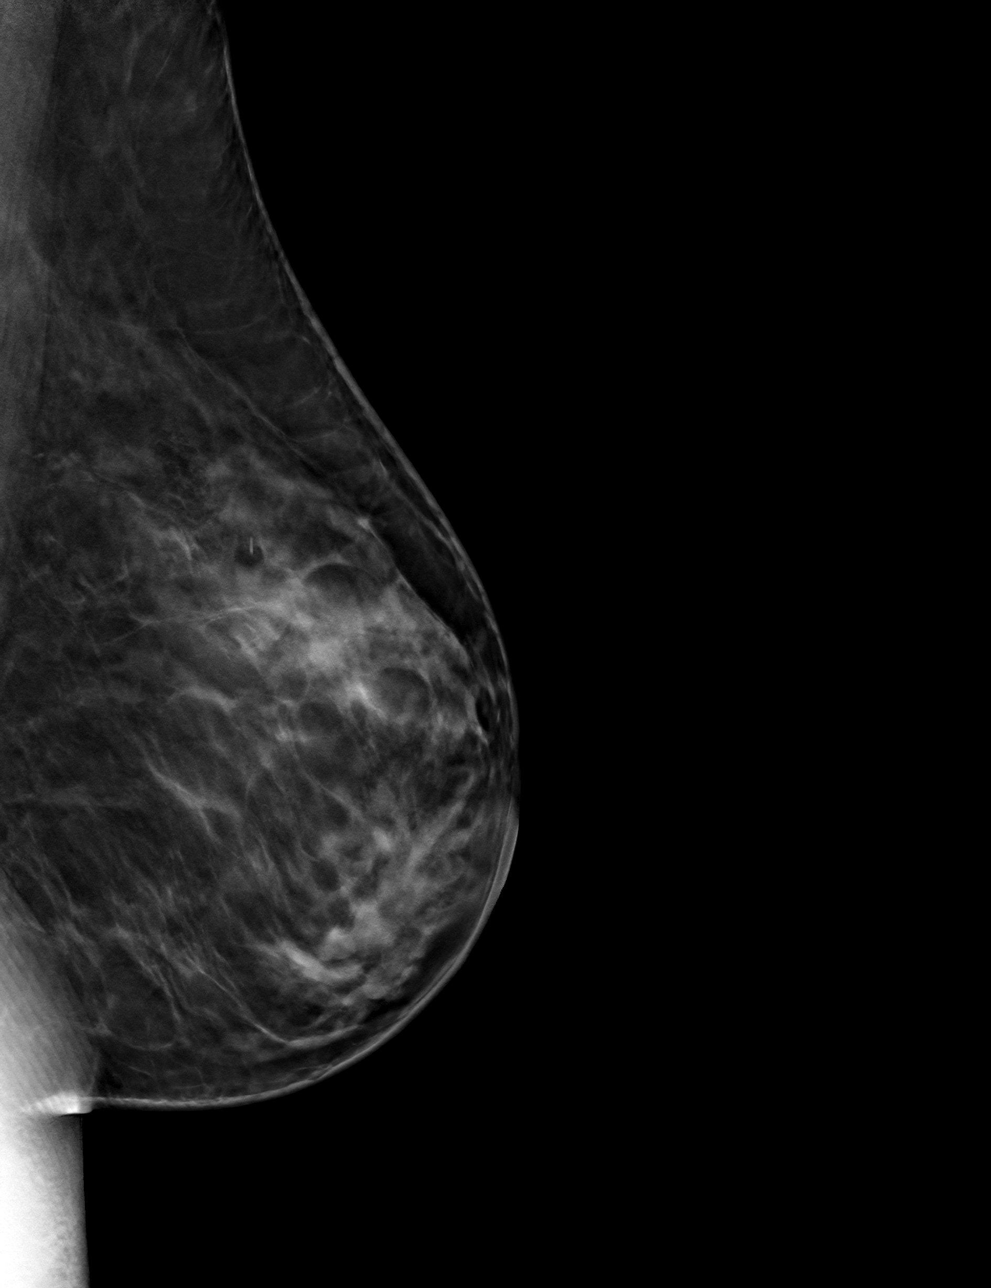

[4 of 12 positions shown; findings below may reference images not displayed]

FINDINGS: Mammographic images were obtained following MRI guided biopsy of
left breast. Two-view mammography demonstrates presence of barbell
marker in expected mammographic position in the left breast upper
outer quadrant.
IMPRESSION: Successful placement of barbell marker post MRI guided core needle
biopsy of the left breast.

Final Assessment: Post Procedure Mammograms for Marker Placement

## 2019-12-22 IMAGING — US US NEEDLE LOCALIZATION*R*
1 series · 5 of 5 positions shown · non-contrast
Comparison: Previous exam(s).

CLINICAL DATA: Patient presents for seed localization prior to
RIGHT mastectomy. Patient had previous ultrasound-guided core biopsy
of an enlarged RIGHT axillary lymph node which was benign but felt
to be discordant. Request is made for seed localization for excision
of this lymph node.

EXAM:
ULTRASOUND GUIDED RADIOACTIVE SEED LOCALIZATION OF THE RIGHT AXILLA

[Series 1: us needle localization*right* · 0.06mm/px · 5 of 5 slices shown]
[im 1/5]
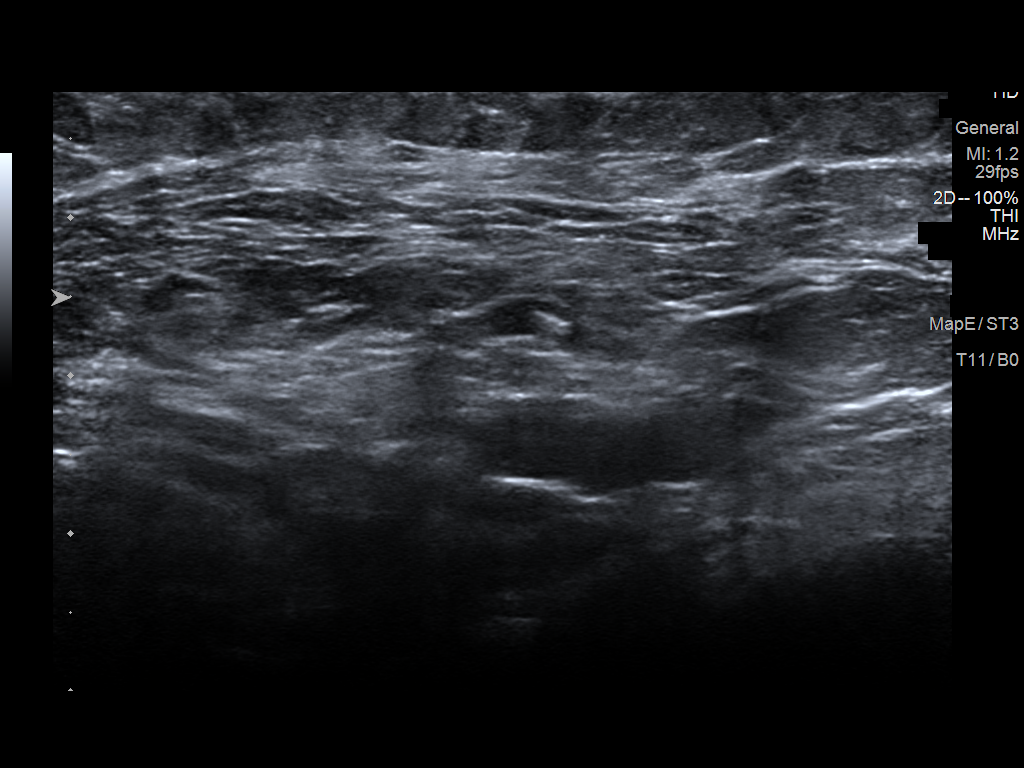
[im 2/5]
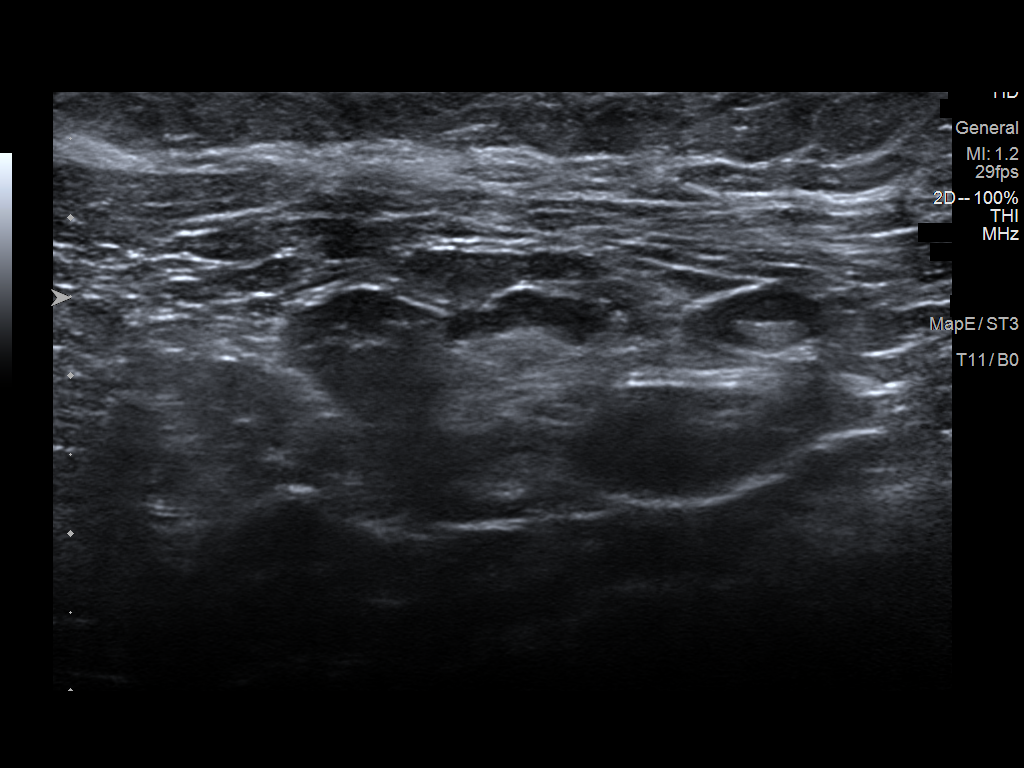
[im 3/5]
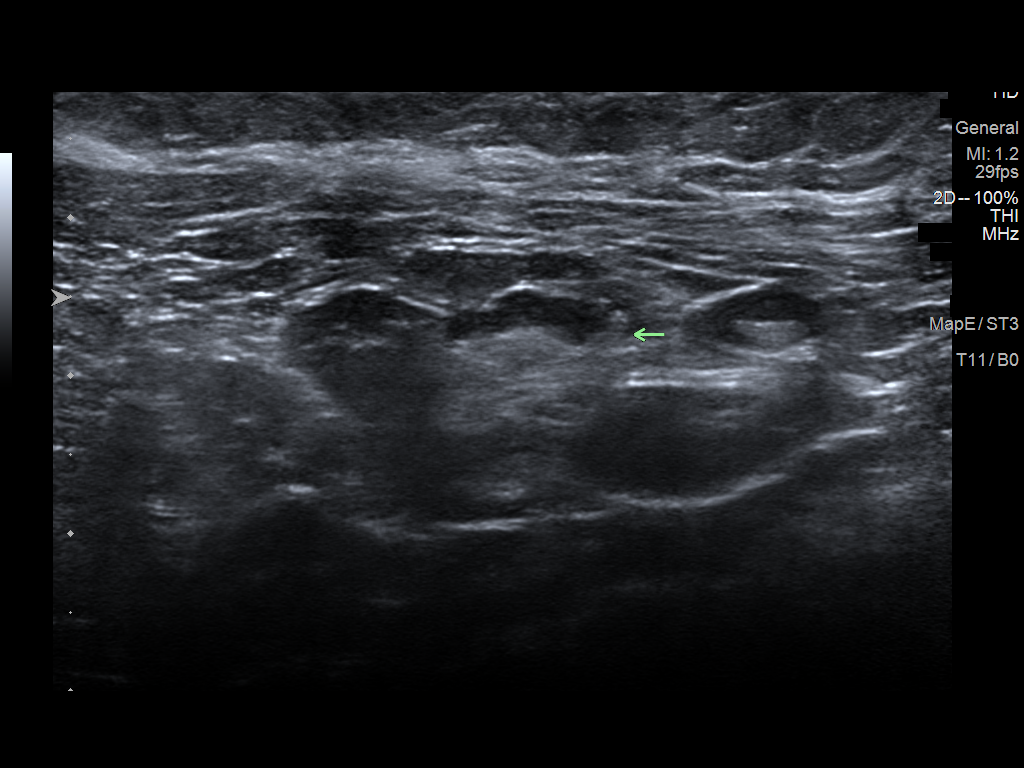
[im 4/5]
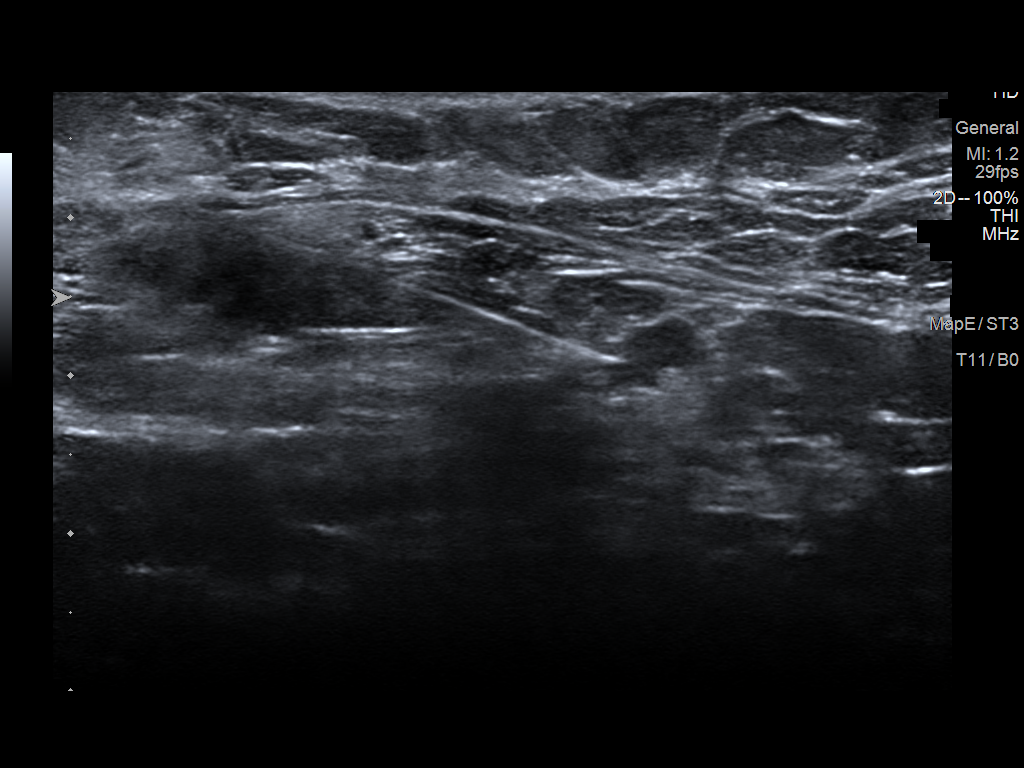
[im 5/5]
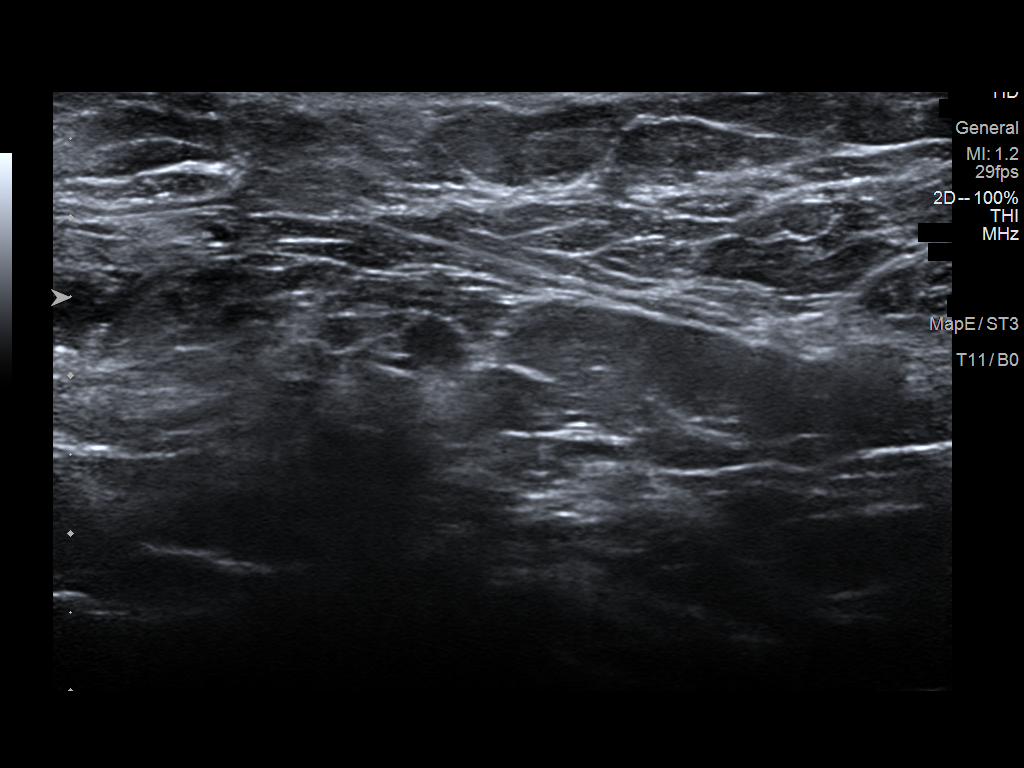

[5 of 5 positions shown; findings below may reference images not displayed]

FINDINGS: Patient presents for radioactive seed localization prior to removal
of RIGHT axillary lymph node. I met with the patient and we
discussed the procedure of seed localization including benefits and
alternatives. We discussed the high likelihood of a successful
procedure. We discussed the risks of the procedure including
infection, bleeding, tissue injury and further surgery. We discussed
the low dose of radioactivity involved in the procedure. Informed,
written consent was given.

The usual time-out protocol was performed immediately prior to the
procedure.

Using ultrasound guidance, sterile technique, 1% lidocaine and an
G-ILL radioactive seed, the spiral shaped clip in the RIGHT axilla
was localized using a LATERAL to the approach. The follow-up
mammogram images confirm the seed in the expected location and were
marked for Dr. Edris.

Follow-up survey of the patient confirms presence of the radioactive
seed.

Order number of G-ILL seed:  [id number].

Total activity:  0.250 millicuries reference Date: 05/08/2019

The patient tolerated the procedure well and was released from the
[REDACTED]. She was given instructions regarding seed removal.
IMPRESSION: Radioactive seed localization RIGHT axilla . No apparent
complications.

## 2019-12-22 IMAGING — MG MM BREAST LOCALIZATION CLIP
2 series · 3 of 6 positions shown · non-contrast
Comparison: Previous exam(s).

CLINICAL DATA: Patient presents for seed localization prior to
RIGHT mastectomy. Patient had previous ultrasound-guided core biopsy
of an enlarged RIGHT axillary lymph node which was benign but felt
to be discordant. Request is made for seed localization for excision
of this lymph node.

EXAM:
ULTRASOUND GUIDED RADIOACTIVE SEED LOCALIZATION OF THE RIGHT AXILLA

[R MLO synth-2D]
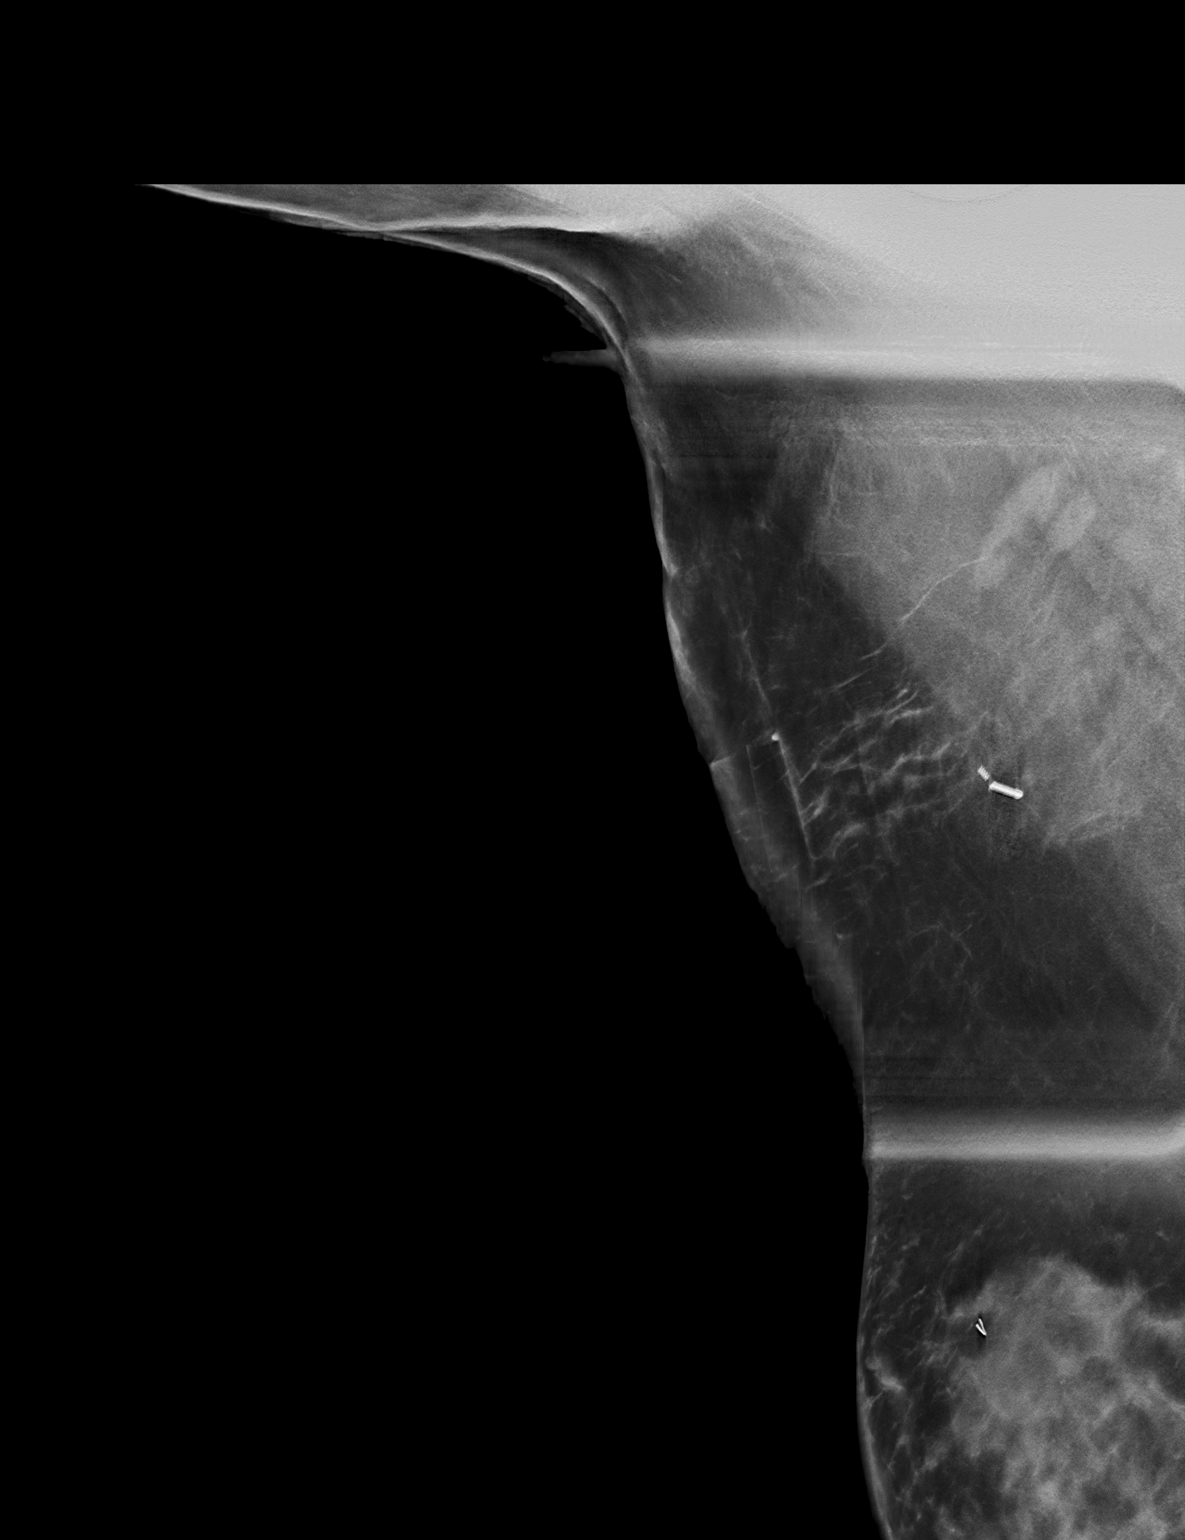

[R MLO tomo · 2 of 100 frames shown]
[frame 33/100]
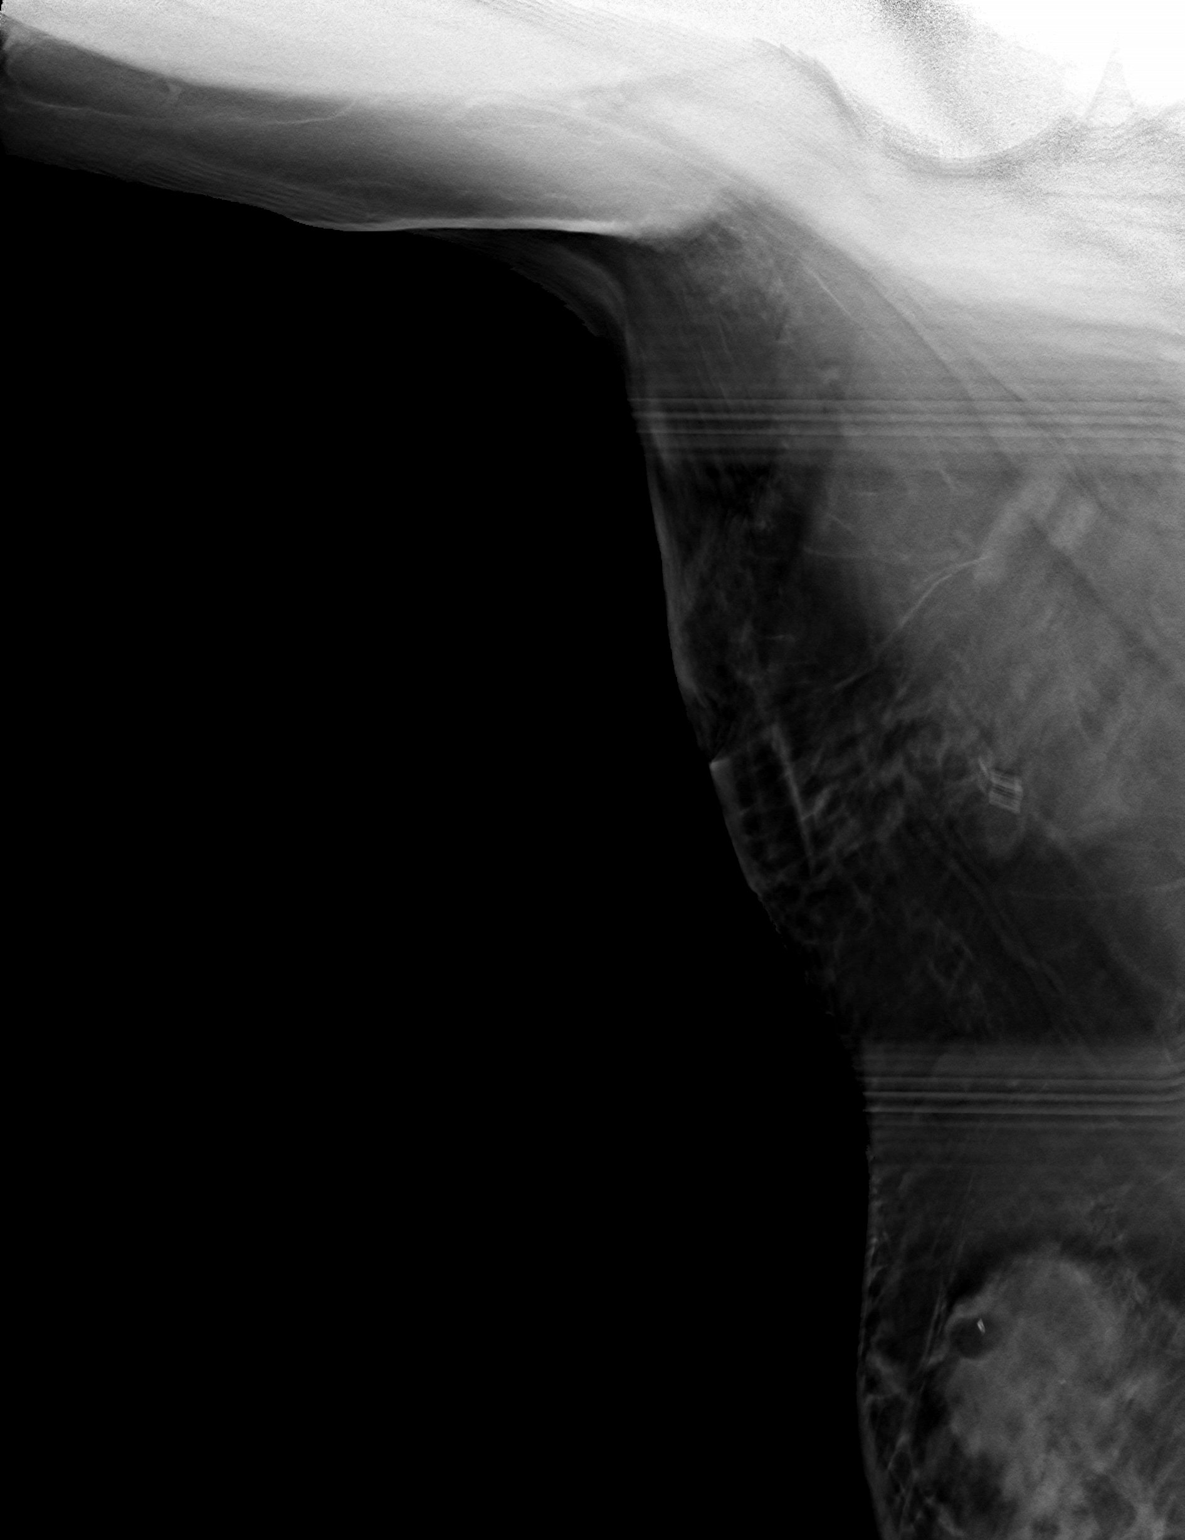
[frame 51/100]
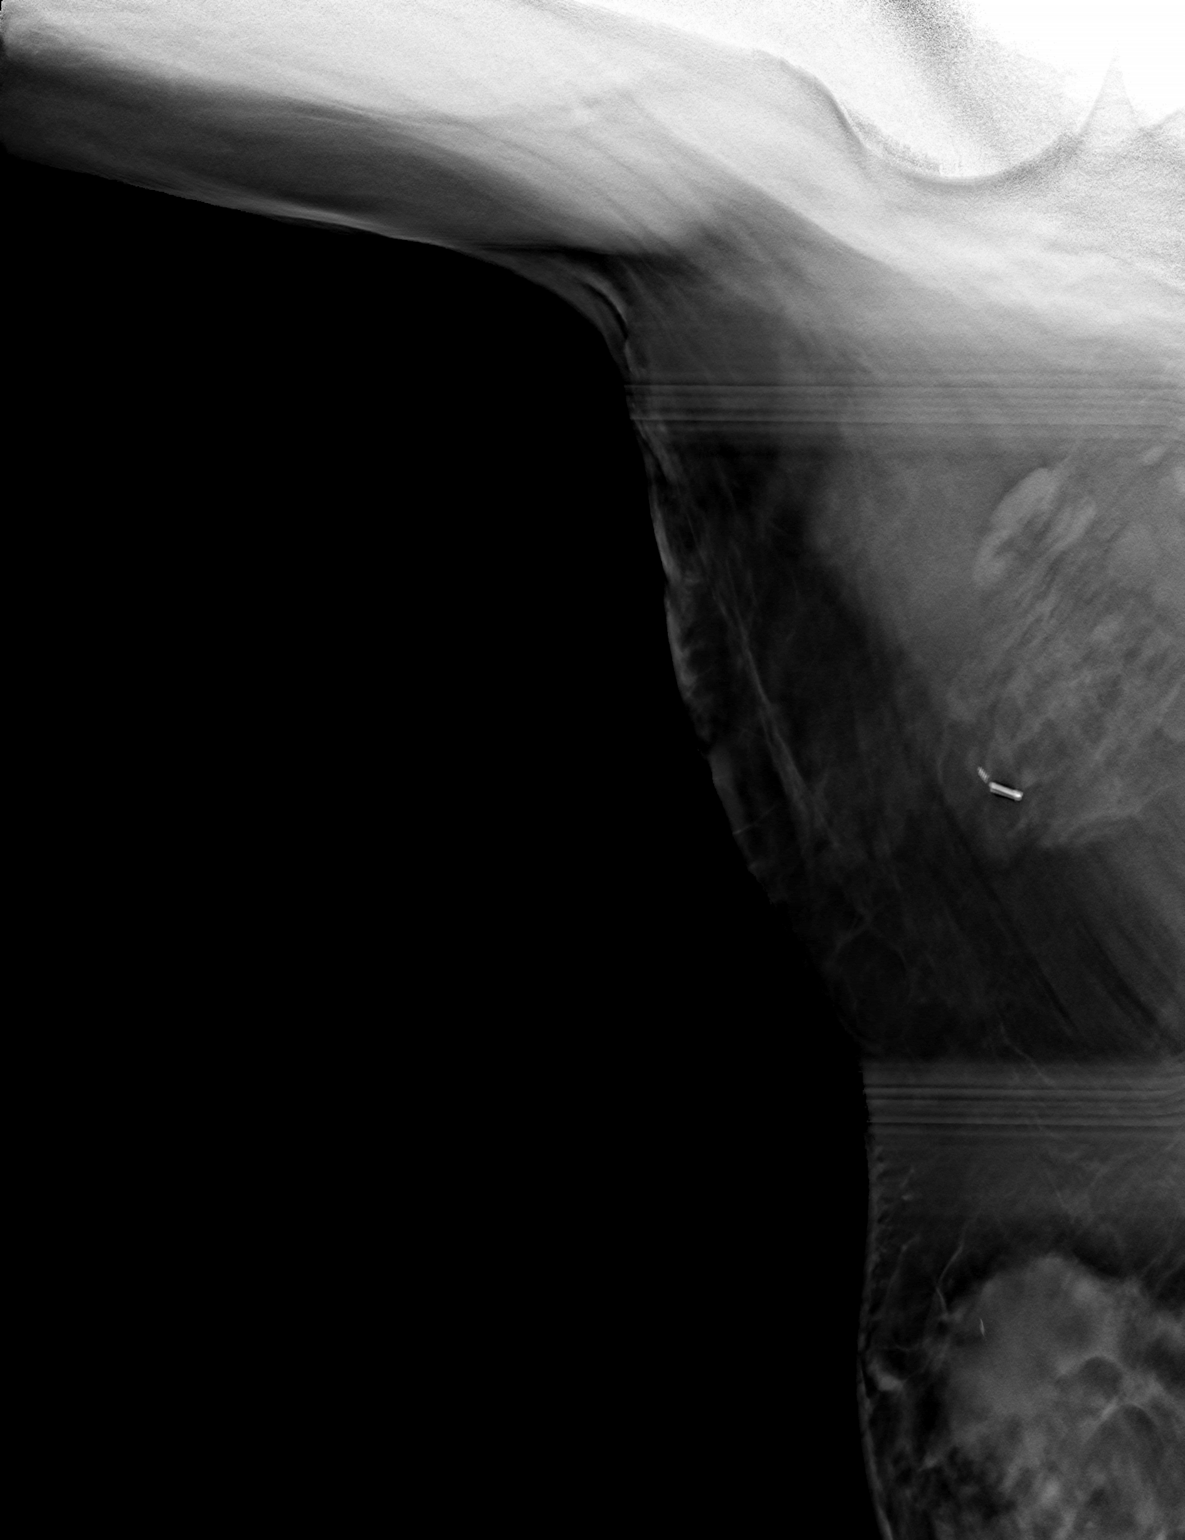

[3 of 6 positions shown; findings below may reference images not displayed]

FINDINGS: Patient presents for radioactive seed localization prior to removal
of RIGHT axillary lymph node. I met with the patient and we
discussed the procedure of seed localization including benefits and
alternatives. We discussed the high likelihood of a successful
procedure. We discussed the risks of the procedure including
infection, bleeding, tissue injury and further surgery. We discussed
the low dose of radioactivity involved in the procedure. Informed,
written consent was given.

The usual time-out protocol was performed immediately prior to the
procedure.

Using ultrasound guidance, sterile technique, 1% lidocaine and an
G-ILL radioactive seed, the spiral shaped clip in the RIGHT axilla
was localized using a LATERAL to the approach. The follow-up
mammogram images confirm the seed in the expected location and were
marked for Dr. Edris.

Follow-up survey of the patient confirms presence of the radioactive
seed.

Order number of G-ILL seed:  [id number].

Total activity:  0.250 millicuries reference Date: 05/08/2019

The patient tolerated the procedure well and was released from the
[REDACTED]. She was given instructions regarding seed removal.
IMPRESSION: Radioactive seed localization RIGHT axilla . No apparent
complications.

## 2019-12-23 IMAGING — MG MM BREAST SURGICAL SPECIMEN
1 series · 1 of 1 positions shown · non-contrast
Comparison: Previous exam(s).

CLINICAL DATA: Evaluate surgical specimen following RIGHT axillary
lymph node excision.

EXAM:
SPECIMEN RADIOGRAPH OF THE RIGHT AXILLA

[R]
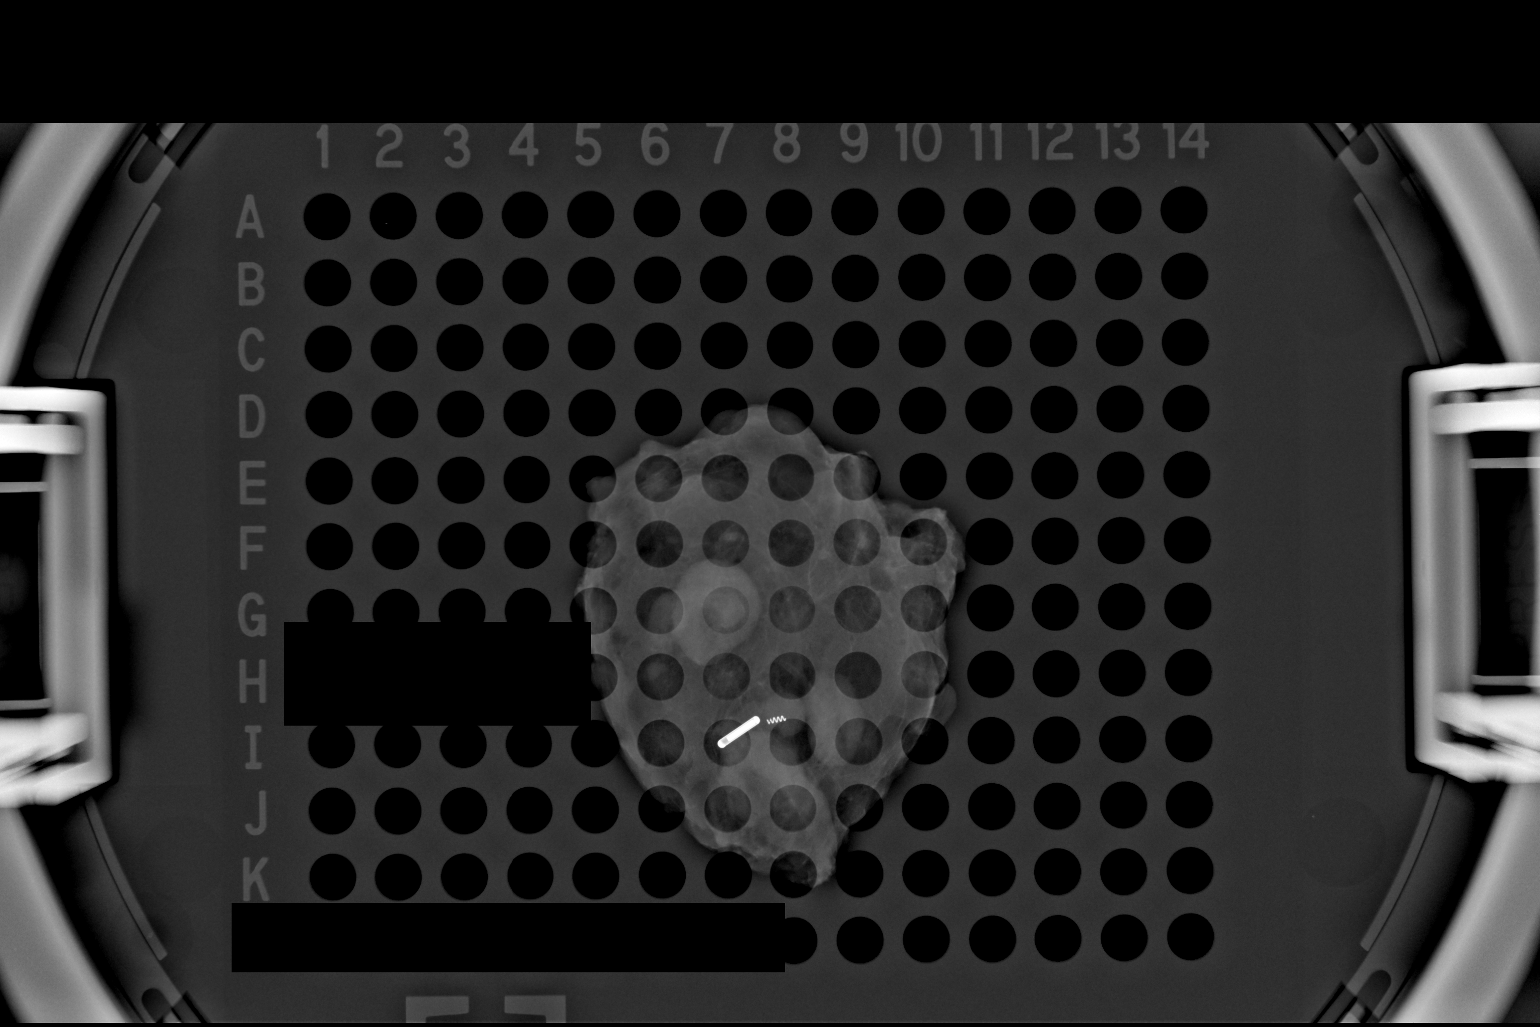

[1 of 1 positions shown; findings below may reference images not displayed]

FINDINGS: Status post excision of the RIGHT axilla. The radioactive seed and
biopsy marker clip are present, completely intact, and were marked
for pathology.
IMPRESSION: Specimen radiograph of the RIGHT axilla

## 2020-11-01 ENCOUNTER — Telehealth: Payer: BC Managed Care – PPO | Admitting: Registered"

## 2020-12-13 ENCOUNTER — Encounter: Payer: Self-pay | Admitting: Registered"

## 2020-12-13 ENCOUNTER — Other Ambulatory Visit: Payer: Self-pay

## 2020-12-13 ENCOUNTER — Encounter: Payer: BC Managed Care – PPO | Attending: Family Medicine | Admitting: Registered"

## 2020-12-13 DIAGNOSIS — E119 Type 2 diabetes mellitus without complications: Secondary | ICD-10-CM | POA: Insufficient documentation

## 2020-12-13 NOTE — Progress Notes (Signed)
Diabetes Self-Management Education  Visit Type: First/Initial  Appt. Start Time: 0910 Appt. End Time: 1020  12/13/2020  Ms. Krystal Davis, identified by name and date of birth, is a 50 y.o. female with a diagnosis of Diabetes: Type 2.   ASSESSMENT  There were no vitals taken for this visit. There is no height or weight on file to calculate BMI.  A1c pt reported 6.9% in Feb 2022; per referral was 7.4% 06/18/20    Pt states she was motivated to bring down A1c in order to have reconstructive surgery s/p mastectomy due to breast cancer. Pt states she has been using CGM data to adjust diet.  Pt states she is concerned about an upcoming trip to [place] because it will not be easy to continue changes as the culture's diet mainly consists of white rice and includes a lot of sweets.   Pt states she started walking frequently (at gym) in 2018-19, but stopped due to [place] and cancer. Pt has resumed frequent exercise.  Re-heat food in microwave on [employer] containers, avoids plastic Sleep: 8-9 hours;    Diabetes Self-Management Education - 12/13/20 0921      Visit Information   Visit Type First/Initial      Initial Visit   Diabetes Type Type 2    Are you currently following a meal plan? No    Are you taking your medications as prescribed? Yes   metformin 500 bid   Date Diagnosed 2017 prediabetes, 2020 diabetes      Health Coping   How would you rate your overall health? Good      Psychosocial Assessment   Patient Belief/Attitude about Diabetes Other (comment)   wants to know how to manage   How often do you need to have someone help you when you read instructions, pamphlets, or other written materials from your doctor or pharmacy? 1 - Never    What is the last grade level you completed in school? masters degree      Complications   Last HgB A1C per patient/outside source 6.9 %   per patient   How often do you check your blood sugar? > 4 times/day    Fasting Blood glucose range (mg/dL)  70-129   100-130   Postprandial Blood glucose range (mg/dL) 130-179   140-180   Number of hypoglycemic episodes per month 0    Have you had a dilated eye exam in the past 12 months? Yes    Have you had a dental exam in the past 12 months? Yes    Are you checking your feet? No      Dietary Intake   Breakfast bagel OR eggs OR waffels OR grain, tea less sugar    Snack (morning) fruit 11:30 - 12    Lunch 2:15 indian food less white rice and more cracked wheat    Snack (afternoon) tea    Dinner 7 pm lentils, vegetables OR meat    Beverage(s) tea, water ~80 oz per day      Exercise   Exercise Type Light (walking / raking leaves)    How many days per week to you exercise? 6    How many minutes per day do you exercise? 60    Total minutes per week of exercise 360      Patient Education   Previous Diabetes Education No    Disease state  Definition of diabetes, type 1 and 2, and the diagnosis of diabetes    Nutrition management  Role  of diet in the treatment of diabetes and the relationship between the three main macronutrients and blood glucose level    Physical activity and exercise  Role of exercise on diabetes management, blood pressure control and cardiac health.    Medications Reviewed patients medication for diabetes, action, purpose, timing of dose and side effects.    Monitoring Identified appropriate SMBG and/or A1C goals.    Chronic complications Assessed and discussed foot care and prevention of foot problems      Individualized Goals (developed by patient)   Nutrition General guidelines for healthy choices and portions discussed    Physical Activity Exercise 5-7 days per week    Medications take my medication as prescribed      Outcomes   Expected Outcomes Demonstrated interest in learning. Expect positive outcomes    Future DMSE PRN    Program Status Completed           Individualized Plan for Diabetes Self-Management Training:   Learning Objective:  Patient will  have a greater understanding of diabetes self-management. Patient education plan is to attend individual and/or group sessions per assessed needs and concerns.   Patient Instructions  Vinegar may help reduce blood sugar rise when taken before meals. Be sure to dilute to prevent damage to tooth enamel and esophagus. Continue eating balanced meals and choosing more whole grains over processed grains. Enjoy some of your favorite foods in moderation and adjust carbohydrates in the meal to prevent blood sugar spikes. Consider taking protein snacks such as nuts with you on your trip to [place]   Expected Outcomes:  Demonstrated interest in learning. Expect positive outcomes  Education material provided: ADA - How to Thrive: A Guide for Your Journey with Diabetes and A1C conversion sheet  If problems or questions, patient to contact team via:  Phone and MyChart  Future DSME appointment: PRN

## 2020-12-13 NOTE — Patient Instructions (Signed)
Vinegar may help reduce blood sugar rise when taken before meals. Be sure to dilute to prevent damage to tooth enamel and esophagus. Continue eating balanced meals and choosing more whole grains over processed grains. Enjoy some of your favorite foods in moderation and adjust carbohydrates in the meal to prevent blood sugar spikes. Consider taking protein snacks such as nuts with you on your trip to [place]
